# Patient Record
Sex: Female | Born: 1945 | Race: White | Hispanic: No | Marital: Married | Smoking: Never smoker
Health system: Southern US, Community
[De-identification: ages and names within clinical notes are randomized; demographics above are authoritative.]

## PROBLEM LIST (undated history)

## (undated) DIAGNOSIS — T8859XA Other complications of anesthesia, initial encounter: Secondary | ICD-10-CM

## (undated) DIAGNOSIS — R51 Headache: Secondary | ICD-10-CM

## (undated) DIAGNOSIS — Z9889 Other specified postprocedural states: Secondary | ICD-10-CM

## (undated) DIAGNOSIS — I82609 Acute embolism and thrombosis of unspecified veins of unspecified upper extremity: Secondary | ICD-10-CM

## (undated) DIAGNOSIS — K219 Gastro-esophageal reflux disease without esophagitis: Secondary | ICD-10-CM

## (undated) DIAGNOSIS — R112 Nausea with vomiting, unspecified: Secondary | ICD-10-CM

## (undated) DIAGNOSIS — K639 Disease of intestine, unspecified: Secondary | ICD-10-CM

## (undated) DIAGNOSIS — Z86718 Personal history of other venous thrombosis and embolism: Secondary | ICD-10-CM

## (undated) DIAGNOSIS — T7840XA Allergy, unspecified, initial encounter: Secondary | ICD-10-CM

## (undated) DIAGNOSIS — I471 Supraventricular tachycardia: Secondary | ICD-10-CM

## (undated) DIAGNOSIS — T4145XA Adverse effect of unspecified anesthetic, initial encounter: Secondary | ICD-10-CM

## (undated) DIAGNOSIS — K589 Irritable bowel syndrome without diarrhea: Secondary | ICD-10-CM

## (undated) DIAGNOSIS — D689 Coagulation defect, unspecified: Secondary | ICD-10-CM

## (undated) HISTORY — DX: Coagulation defect, unspecified: D68.9

## (undated) HISTORY — DX: Gastro-esophageal reflux disease without esophagitis: K21.9

## (undated) HISTORY — DX: Supraventricular tachycardia: I47.1

## (undated) HISTORY — DX: Acute embolism and thrombosis of unspecified veins of unspecified upper extremity: I82.609

## (undated) HISTORY — DX: Allergy, unspecified, initial encounter: T78.40XA

## (undated) HISTORY — DX: Irritable bowel syndrome, unspecified: K58.9

## (undated) HISTORY — DX: Disease of intestine, unspecified: K63.9

## (undated) HISTORY — DX: Personal history of other venous thrombosis and embolism: Z86.718

---

## 1974-11-10 HISTORY — PX: OTHER SURGICAL HISTORY: SHX169

## 1974-11-10 HISTORY — PX: BREAST EXCISIONAL BIOPSY: SUR124

## 1975-11-11 HISTORY — PX: CYSTECTOMY: SUR359

## 1996-09-28 ENCOUNTER — Encounter: Payer: Self-pay | Admitting: Internal Medicine

## 1999-06-24 ENCOUNTER — Other Ambulatory Visit: Admission: RE | Admit: 1999-06-24 | Discharge: 1999-06-24 | Payer: Self-pay | Admitting: Family Medicine

## 2000-01-29 ENCOUNTER — Encounter: Payer: Self-pay | Admitting: Family Medicine

## 2000-01-29 ENCOUNTER — Encounter: Admission: RE | Admit: 2000-01-29 | Discharge: 2000-01-29 | Payer: Self-pay | Admitting: Family Medicine

## 2000-05-12 ENCOUNTER — Other Ambulatory Visit: Admission: RE | Admit: 2000-05-12 | Discharge: 2000-05-12 | Payer: Self-pay | Admitting: Family Medicine

## 2001-03-17 ENCOUNTER — Emergency Department (HOSPITAL_COMMUNITY): Admission: EM | Admit: 2001-03-17 | Discharge: 2001-03-17 | Payer: Self-pay | Admitting: Emergency Medicine

## 2001-05-31 ENCOUNTER — Encounter: Payer: Self-pay | Admitting: Obstetrics and Gynecology

## 2001-05-31 ENCOUNTER — Encounter: Admission: RE | Admit: 2001-05-31 | Discharge: 2001-05-31 | Payer: Self-pay | Admitting: Obstetrics and Gynecology

## 2001-06-01 ENCOUNTER — Other Ambulatory Visit: Admission: RE | Admit: 2001-06-01 | Discharge: 2001-06-01 | Payer: Self-pay | Admitting: Obstetrics and Gynecology

## 2001-06-01 ENCOUNTER — Encounter (INDEPENDENT_AMBULATORY_CARE_PROVIDER_SITE_OTHER): Payer: Self-pay

## 2002-01-12 ENCOUNTER — Encounter: Payer: Self-pay | Admitting: Family Medicine

## 2002-01-12 ENCOUNTER — Ambulatory Visit (HOSPITAL_COMMUNITY): Admission: RE | Admit: 2002-01-12 | Discharge: 2002-01-12 | Payer: Self-pay | Admitting: Family Medicine

## 2002-08-26 ENCOUNTER — Encounter: Payer: Self-pay | Admitting: Obstetrics and Gynecology

## 2002-08-26 ENCOUNTER — Encounter: Admission: RE | Admit: 2002-08-26 | Discharge: 2002-08-26 | Payer: Self-pay | Admitting: Obstetrics and Gynecology

## 2003-04-19 ENCOUNTER — Other Ambulatory Visit: Admission: RE | Admit: 2003-04-19 | Discharge: 2003-04-19 | Payer: Self-pay | Admitting: Obstetrics and Gynecology

## 2003-06-07 ENCOUNTER — Encounter: Payer: Self-pay | Admitting: Obstetrics and Gynecology

## 2003-06-07 ENCOUNTER — Encounter: Admission: RE | Admit: 2003-06-07 | Discharge: 2003-06-07 | Payer: Self-pay | Admitting: Obstetrics and Gynecology

## 2004-04-15 ENCOUNTER — Encounter: Admission: RE | Admit: 2004-04-15 | Discharge: 2004-04-15 | Payer: Self-pay | Admitting: Obstetrics and Gynecology

## 2004-04-19 ENCOUNTER — Encounter: Admission: RE | Admit: 2004-04-19 | Discharge: 2004-04-19 | Payer: Self-pay | Admitting: Obstetrics and Gynecology

## 2004-05-07 ENCOUNTER — Other Ambulatory Visit: Admission: RE | Admit: 2004-05-07 | Discharge: 2004-05-07 | Payer: Self-pay | Admitting: Obstetrics and Gynecology

## 2005-04-25 ENCOUNTER — Encounter: Admission: RE | Admit: 2005-04-25 | Discharge: 2005-04-25 | Payer: Self-pay | Admitting: Obstetrics and Gynecology

## 2005-05-09 ENCOUNTER — Other Ambulatory Visit: Admission: RE | Admit: 2005-05-09 | Discharge: 2005-05-09 | Payer: Self-pay | Admitting: Obstetrics and Gynecology

## 2006-04-29 ENCOUNTER — Encounter: Admission: RE | Admit: 2006-04-29 | Discharge: 2006-04-29 | Payer: Self-pay | Admitting: Obstetrics and Gynecology

## 2007-05-12 ENCOUNTER — Encounter: Admission: RE | Admit: 2007-05-12 | Discharge: 2007-05-12 | Payer: Self-pay | Admitting: Obstetrics and Gynecology

## 2008-05-08 ENCOUNTER — Telehealth: Payer: Self-pay | Admitting: Gastroenterology

## 2008-05-10 ENCOUNTER — Ambulatory Visit: Payer: Self-pay | Admitting: Internal Medicine

## 2008-05-10 DIAGNOSIS — E785 Hyperlipidemia, unspecified: Secondary | ICD-10-CM

## 2008-05-10 HISTORY — DX: Hyperlipidemia, unspecified: E78.5

## 2008-05-11 DIAGNOSIS — K6289 Other specified diseases of anus and rectum: Secondary | ICD-10-CM

## 2008-05-15 ENCOUNTER — Encounter: Admission: RE | Admit: 2008-05-15 | Discharge: 2008-05-15 | Payer: Self-pay | Admitting: Family Medicine

## 2008-05-18 ENCOUNTER — Telehealth: Payer: Self-pay | Admitting: Internal Medicine

## 2008-05-23 ENCOUNTER — Ambulatory Visit: Payer: Self-pay | Admitting: Internal Medicine

## 2008-06-16 ENCOUNTER — Telehealth: Payer: Self-pay | Admitting: Internal Medicine

## 2008-06-19 ENCOUNTER — Telehealth: Payer: Self-pay | Admitting: Internal Medicine

## 2008-06-21 ENCOUNTER — Telehealth: Payer: Self-pay | Admitting: Internal Medicine

## 2008-06-21 DIAGNOSIS — K648 Other hemorrhoids: Secondary | ICD-10-CM

## 2008-06-21 HISTORY — DX: Other hemorrhoids: K64.8

## 2008-06-22 ENCOUNTER — Ambulatory Visit: Payer: Self-pay | Admitting: Gastroenterology

## 2009-05-16 ENCOUNTER — Encounter: Admission: RE | Admit: 2009-05-16 | Discharge: 2009-05-16 | Payer: Self-pay | Admitting: Obstetrics and Gynecology

## 2009-05-22 ENCOUNTER — Encounter: Admission: RE | Admit: 2009-05-22 | Discharge: 2009-05-22 | Payer: Self-pay | Admitting: Obstetrics and Gynecology

## 2010-03-10 ENCOUNTER — Encounter: Payer: Self-pay | Admitting: Internal Medicine

## 2010-03-10 ENCOUNTER — Emergency Department (HOSPITAL_COMMUNITY): Admission: EM | Admit: 2010-03-10 | Discharge: 2010-03-10 | Payer: Self-pay | Admitting: Emergency Medicine

## 2010-03-11 ENCOUNTER — Encounter: Payer: Self-pay | Admitting: Internal Medicine

## 2010-04-22 ENCOUNTER — Encounter: Payer: Self-pay | Admitting: Internal Medicine

## 2010-04-25 ENCOUNTER — Ambulatory Visit: Payer: Self-pay | Admitting: Internal Medicine

## 2010-04-25 DIAGNOSIS — I498 Other specified cardiac arrhythmias: Secondary | ICD-10-CM

## 2010-04-25 HISTORY — DX: Other specified cardiac arrhythmias: I49.8

## 2010-05-20 ENCOUNTER — Encounter: Admission: RE | Admit: 2010-05-20 | Discharge: 2010-05-20 | Payer: Self-pay | Admitting: Obstetrics and Gynecology

## 2010-08-09 ENCOUNTER — Ambulatory Visit: Payer: Self-pay | Admitting: Internal Medicine

## 2010-08-09 DIAGNOSIS — R002 Palpitations: Secondary | ICD-10-CM | POA: Insufficient documentation

## 2010-08-09 DIAGNOSIS — R61 Generalized hyperhidrosis: Secondary | ICD-10-CM

## 2010-08-09 HISTORY — DX: Palpitations: R00.2

## 2010-08-09 HISTORY — DX: Generalized hyperhidrosis: R61

## 2010-08-23 ENCOUNTER — Telehealth: Payer: Self-pay | Admitting: Internal Medicine

## 2010-09-12 ENCOUNTER — Telehealth: Payer: Self-pay | Admitting: Internal Medicine

## 2010-09-17 ENCOUNTER — Telehealth (INDEPENDENT_AMBULATORY_CARE_PROVIDER_SITE_OTHER): Payer: Self-pay | Admitting: *Deleted

## 2010-09-18 ENCOUNTER — Telehealth (INDEPENDENT_AMBULATORY_CARE_PROVIDER_SITE_OTHER): Payer: Self-pay | Admitting: *Deleted

## 2010-09-20 ENCOUNTER — Ambulatory Visit: Payer: Self-pay | Admitting: Internal Medicine

## 2010-10-28 ENCOUNTER — Telehealth: Payer: Self-pay | Admitting: Internal Medicine

## 2010-11-21 ENCOUNTER — Encounter
Admission: RE | Admit: 2010-11-21 | Discharge: 2010-11-21 | Payer: Self-pay | Source: Home / Self Care | Attending: Obstetrics and Gynecology | Admitting: Obstetrics and Gynecology

## 2010-12-01 ENCOUNTER — Encounter: Payer: Self-pay | Admitting: Obstetrics and Gynecology

## 2010-12-02 ENCOUNTER — Encounter: Payer: Self-pay | Admitting: Obstetrics and Gynecology

## 2010-12-10 NOTE — Progress Notes (Signed)
Summary: Event Monitor  Phone Note Outgoing Call Call back at [facility] Phone [phone] Call back at Work Phone [phone]   Call placed by: Stanton Kidney, EMT-P,  September 18, 2010 2:19 PM Summary of Call: Left message for Pt. to call us to schedule 30 day event monitor. Stanton Kidney, EMT-P  September 18, 2010 2:19 PM   Follow-up for Phone Call        Pt had monitor done 11/11 Follow-up by: Marcos Eke,  September 24, 2010 8:55 AM

## 2010-12-10 NOTE — Progress Notes (Signed)
  Phone Note Outgoing Call   Call placed by: rhonda Call placed to: Patient Summary of Call: left message to adv pt ok to come off Cardizem per Dr. Graciela Husbands. Will also order a 30 day event monitor that is patient activated, single event with auto detection.  Initial call taken by: Claris Gladden RN,  September 17, 2010 6:17 PM

## 2010-12-10 NOTE — Progress Notes (Signed)
Summary: calling regarding heart monitor  Phone Note Call from Patient Call back at [phone]   Caller: Patient Summary of Call: Pt calling regarding a heart monitor Initial call taken by: Judie Grieve,  September 12, 2010 11:24 AM  Follow-up for Phone Call        spoke w/pt and she has increased her vit-d and the night sweats, fatigue and problem sleeping seemed to have resolved.  adv her that we do not have resolution on the monitor.  she is wondering if she can come off the Diltiazem. Adv her that Dr. Graciela Husbands will make that decision.  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 We concur it may be a cause but may not be the only problem.  Follow-up by: Claris Gladden RN,  September 12, 2010 12:10 PM

## 2010-12-10 NOTE — Assessment & Plan Note (Signed)
Summary: nep/SVT   Primary Provider:  Sheliah Hatch   History of Present Illness: [name] is seen at the request of Dr Jeannetta Nap for treatment of SVT,  She is the mother of one of the nurses on 2000;  she has a 3 month history of recurrent abrupt onset tacdhypalps assoc with LH and presyncope, with SOB and flushing.  They are frog positive and diuretic positve.  The freq can be daily but now is only ocuring 2-3/wk with episodes lsating 20-30 imn.  They freuqently occur at night and are assoc w residual fatigue.    Aprrrox 6 weeks ago, she ended up with an episode that lasted all day long. Upon arrival at [facility] ER she was given adenosine with termination. Her heart rate was documented at 170 beats per minute. These records were reviewed.  gross able to obtain and echo cardiogram obtained at Dr. Elvis Coil office demonstrated normal left ventricular function   At that time it was recommended that she discontinue caffeine and decongestants as well as her antimigraine therapies. She has not noted a significant change.  apart from these spells, she denies exercise intolerance nocturnal dyspnea orthopnea. She does not have any snoring.  She has noted nonexertional arm and back discomfort etc. quite limited.  She has a long-standing history of dyslipidemia for which his statin therapy has been prescribed. She noted that with the jaw discomfort any worse she discontinued her statin. The symptoms resolved. She resumed her statin and he recurred. Because of that she has been started more recently on lovastatin as her placement for her atorvastatin  Current Medications (verified): 1)  Vitamin D 13086 Unit  Caps (Ergocalciferol) .... One Tablet By Mouth Every 2 Wks 2)  Calcium Carbonate-Vitamin D 600-400 Mg-Unit  Tabs (Calcium Carbonate-Vitamin D) .... One Tablet By Mouth Once Daily 3)  Aspirin 81 Mg  Tabs (Aspirin) .... One Tablet By Mouth Once Daily 4)  Lovastatin 20 Mg Tabs (Lovastatin) .... One  Tablet At Bedtime 5)  B Complex Vitamins  Caps (B Complex Vitamins) .... Once Daily 6)  Metoprolol Tartrate 25 Mg Tabs (Metoprolol Tartrate) .... One Tablet Once Daily 7)  Daily Multi  Tabs (Multiple Vitamins-Minerals) .... Take One Tablet Once Daily  Allergies (verified): 1)  ! Sulfa 2)  ! * Estrogen 3)  ! Codeine  Past History:  Past Surgical History: Last updated: 05/10/2008 Cyst removed from the Left Breast-1977  Family History: Last updated: 05/10/2008 Family History of Colon Cancer:Mother late 28's early 17's Family History of Colon Polyps:Mother Family History of Diabetes: Mother,Father Family History of Heart Disease: Mother, Father Family History of Irritable Bowel Syndrome:Mother  Social History: Last updated: 06/22/2008 Married Occupation: Runner, 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  Alcohol Use - no Daily Caffeine Use Illicit Drug Use - no Patient gets regular exercise.  Past Medical History: Blood clots -1976,1981(thrombosis of the arm) supraventricular tachycardia hyperlipidemia GE reflux disease/irritable bowel disease  Review of Systems       full review of systems was negative apart from a history of present illness and past medical history. Tinnitus and perimenopausal night sweats  Vital Signs:  Patient profile:   65 year old female Height:      64 inches Weight:      194 pounds BMI:     33.42 Pulse rate:   64 / minute Pulse rhythm:   regular BP sitting:   120 / 76  (left arm) Cuff size:   large  Vitals Entered By: Judithe Modest CMA (  April 25, 2010 3:24 PM)  Physical Exam  General:  Alert and orientedmiddle aged [place] female in no acute distress. HEENT  normal . Neck veins were flat; carotids brisk and full without bruits. No lymphadenopathy. Back without kyphosis. Lungs clear. Heart sounds regular without murmurs or gallops. PMI nondisplaced. Abdomen soft with active bowel sounds without midline pulsation or hepatomegaly. Femoral pulses and  distal pulses intact. Extremities were without clubbing cyanosis or edemaSkin warm and dry. Neurological exam grossly normal LN neg; affect engaging   EKG  Procedure date:  03/10/2010  Findings:      neuro QRS tachycardia at a cycle length of 350 ms R. 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 She's had significant and concerning symptoms including lightheadedness and presyncope. We discussed treatment options including alternative AV nodal blocking agents, antiarrhythmic drug therapy with the potential for poor arrhythmia, and catheter ablation. We discussed the potential benefits as well as the potential risks including but not limited to death and heart block requiring pacemaker implantation. She has had significant fatigue associated with her current dose of beta blocker.  After the discussion she elected to pursue medical therapy. We have given her prescriptions to metoprolol succinate 50, atenolol 50, and diltiazem 120. She will them inn  random order and we will see her again in about 8 weeks' time. Her updated medication list for this problem includes:    Aspirin 81 Mg Tabs (Aspirin) ..... One tablet by mouth once daily    Metoprolol Tartrate 25 Mg Tabs (Metoprolol tartrate) ..... One tablet once daily  Orders: EKG w/ Interpretation (93000)  Patient Instructions: 1)  Your physician has recommended you make the following change in your medication: Try Metoprolol, Atenolol, and Cardizem.  Let us know which one you like the best. 2)  Your physician recommends that you schedule a follow-up appointment in: 2  months with Dr Graciela Husbands.

## 2010-12-10 NOTE — Letter (Signed)
Summary: [facility]  [facility]   Imported By: Marylou Mccoy 05/21/2010 08:56:19  _____________________________________________________________________  External Attachment:    Type:   Image     Comment:   External Document

## 2010-12-10 NOTE — Progress Notes (Signed)
Summary: ? ON MONITOR  Phone Note Call from Patient Call back at [phone]   Caller: Patient Reason for Call: Talk to Nurse Summary of Call: QUESTION ABOUT MONITOR. PT STATES DR. Rene Kocher GOING TO CALL HER ABOUT GETTING ONE FOR A MONTH. Initial call taken by: Roe Coombs,  August 23, 2010 9:17 AM  Follow-up for Phone Call        checking into what kind of monitor is needed Meredith Staggers, RN  August 23, 2010 10:01 AM   per Dr Odessa Fleming note needs a MCOT not sure if 24, 48 or 30 day event is needed, will have Dr Graciela Husbands review on Wed and will let pt know, pt also states she had a tsh w/pcp and it was 3.16, she has not had a cbc or vit D level Meredith Staggers, RN  August 23, 2010 10:32 AM   Additional Follow-up for Phone Call Additional follow up Details #1::        left msg for pt that will discuss w/ Dr. Graciela Husbands. He had intended to use another vendor's monitor but may not be the case now.  Additional Follow-up by: Claris Gladden RN,  August 26, 2010 2:34 PM    Additional Follow-up for Phone Call Additional follow up Details #2::    30 day mcot monitor is great Follow-up by: Nathen May, MD, [facility],  August 29, 2010 6:27 PM

## 2010-12-10 NOTE — Assessment & Plan Note (Signed)
Summary: rov per pt call/lg   Visit Type:  Follow-up Primary Provider:  Sheliah Hatch  CC:  tachycardia.  History of Present Illness: [name] is seen in followup for SVT,  after lengthy discussions at her last visit, we elected to pursue medical therapy as opposed to catheter ablation.  she had a very quiet summer. However upon return to school her symptoms have recurred. She is on a heart rate monitoring she notes that her tachy palpitations are associated with a heart rate of 110 quite distinct from the documented heart rate of 170 with her SVT.  She does have some lightheadedness with this these episodes are also accompanied by nausea and some diaphoresis. She denies shower intolerance or orthostatic intolerance.  She also reports night sweats dating back to January. Interval has been associated with some weight gain and no appreciable change in her bowel pattern. She is not sure whether thyroid testing has been done     echo cardiogram obtained at Dr. Elvis Coil office demonstrated normal left ventricular function      Current Medications (verified): 1)  Vitamin D 19147 Unit  Caps (Ergocalciferol) .... One Tablet By Mouth Every 2 Wks 2)  Calcium Carbonate-Vitamin D 600-400 Mg-Unit  Tabs (Calcium Carbonate-Vitamin D) .... One Tablet By Mouth Once Daily 3)  Aspirin 81 Mg  Tabs (Aspirin) .... One Tablet By Mouth Once Daily 4)  Lovastatin 20 Mg Tabs (Lovastatin) .... One Tablet At Bedtime 5)  B Complex Vitamins  Caps (B Complex Vitamins) .... Once Daily 6)  Metoprolol Tartrate 25 Mg Tabs (Metoprolol Tartrate) .... One Tablet Once Daily 7)  Daily Multi  Tabs (Multiple Vitamins-Minerals) .... Take One Tablet Once Daily 8)  Diltiazem Hcl Cr 120 Mg Xr12h-Cap (Diltiazem Hcl) .... Take One Tablet Once Daily  Allergies (verified): 1)  ! Sulfa 2)  ! * Estrogen 3)  ! Codeine  Past History:  Past Medical History: Last updated: 04/25/2010 Blood clots -1976,1981(thrombosis of the  arm) supraventricular tachycardia hyperlipidemia GE reflux disease/irritable bowel disease  Past Surgical History: Last updated: 05/10/2008 Cyst removed from the Left Breast-1977  Family History: Last updated: 05/10/2008 Family History of Colon Cancer:Mother late 63's early 74's Family History of Colon Polyps:Mother Family History of Diabetes: Mother,Father Family History of Heart Disease: Mother, Father Family History of Irritable Bowel Syndrome:Mother  Social History: Last updated: 06/22/2008 Married Occupation: Runner, 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  Alcohol Use - no Daily Caffeine Use Illicit Drug Use - no Patient gets regular exercise.  Risk Factors: Exercise: yes (06/22/2008)  Risk Factors: Smoking Status: never (05/10/2008)  Vital Signs:  Patient profile:   65 year old female Height:      64 inches Weight:      191 pounds BMI:     32.90 Pulse rate:   65 / minute BP sitting:   128 / 73  (left arm) Cuff size:   regular  Vitals Entered By: Hardin Negus, RMA (August 09, 2010 4:29 PM)  Physical Exam  General:  The patient was alert and oriented in no acute distress. HEENT Normal.  Neck veins were flat, carotids were brisk.  Lungs were clear.  Heart sounds were regular without murmurs or gallops.  Abdomen was soft with active bowel sounds. There is no clubbing cyanosis or edema. Skin Warm and dry    Impression & Recommendations:  Problem # 1:  SUPRAVENTRICULAR TACHYCARDIA (ICD-427.89) the patient has not had any clear episodes of her rapid SVT. She is tolerating her medications relatively well.  She has had documented heart rates of over 100 however. This is distinct from 170 or so  for SVT. The associated symptoms suggest that this might be related to an autonomic problem. The fact that his occurred during school resolved during the summer and has reoccurred with the beginning of the school year makes me wonder whether it is a stress manifestation.     We have discussed the use of event recording tools to try to elucidate whether this is a primary or secondary rhythm issue as it would help direct therapeutic options. The value of  a MCOT monitor will depend on her ability to retrieve reliably information from patient activated logs. My recollection is that we just met with a company that may have the potential for [place]. Her updated medication list for this problem includes:    Aspirin 81 Mg Tabs (Aspirin) ..... One tablet by mouth once daily    Metoprolol Tartrate 25 Mg Tabs (Metoprolol tartrate) ..... One tablet once daily    Diltiazem Hcl Cr 120 Mg Xr12h-cap (Diltiazem hcl) .Marland Kitchen... Take one tablet once daily  Problem # 2:  PALPITATIONS (ICD-785.1) as above Her updated medication list for this problem includes:    Aspirin 81 Mg Tabs (Aspirin) ..... One tablet by mouth once daily    Metoprolol Tartrate 25 Mg Tabs (Metoprolol tartrate) ..... One tablet once daily    Diltiazem Hcl Cr 120 Mg Xr12h-cap (Diltiazem hcl) .Marland Kitchen... Take one tablet once daily  Problem # 3:  DIAPHORESIS (ICD-780.8) she has had diaphoresis with these palpitation episodes as well his nocturnal diaphoresis. She is well postmenopausal and had had 5 or 6 years of no night sweats. This raises concerns regarding other mechanisms. Identification of the heart rate may be clarifying as there may in fact be a primary rhythm issue. We will try to retrieve results regarding her thyroid and CBC from Dr. Hennie Duos office.  Patient Instructions: 1)  Your physician recommends that you schedule a follow-up appointment in: 6 months 2)  Get information from Dr. Hennie Duos office on thyroid and blood count 3)  Will utilize an event recorder to try to understand the mechanism of the palpitations

## 2010-12-10 NOTE — Letter (Signed)
Summary: [facility] - [facility]  [facility]   Imported By: Marylou Mccoy 05/21/2010 08:57:38  _____________________________________________________________________  External Attachment:    Type:   Image     Comment:   External Document

## 2010-12-12 NOTE — Progress Notes (Addendum)
Summary: results from heart monitor  Phone Note Call from Patient Call back at Home Phone [phone] Call back at [phone]   Caller: Patient Summary of Call: Results from heart monitor Initial call taken by: Judie Grieve,  October 28, 2010 11:23 AM  Follow-up for Phone Call        left msg to adv have results and not reviewed by md. adv will call with results when have them.  Follow-up by: Claris Gladden RN,  October 28, 2010 3:46 PM     Appended Document: results from heart monitor monitor is normal  steve  Appended Document: results from heart monitor adv pt of normal results. Claris Gladden, RN, BSN

## 2010-12-12 NOTE — Procedures (Signed)
Summary: Summary Report  Summary Report   Imported By: Erle Crocker 11/13/2010 13:23:01  _____________________________________________________________________  External Attachment:    Type:   Image     Comment:   External Document

## 2010-12-13 NOTE — Letter (Signed)
Summary: [facility]  [facility]   Imported By: Marylou Mccoy 05/20/2010 16:57:03  _____________________________________________________________________  External Attachment:    Type:   Image     Comment:   External Document

## 2011-01-28 LAB — POCT CARDIAC MARKERS: Troponin i, poc: <0.05 ng/mL (ref 0.00–0.09)

## 2011-01-28 LAB — POCT I-STAT, CHEM 8
Glucose, Bld: 123 mg/dL — ABNORMAL HIGH (ref 70–99)
Hemoglobin: 16 g/dL — ABNORMAL HIGH (ref 12.0–15.0)
Potassium: 4.1 mEq/L (ref 3.5–5.1)

## 2011-05-29 ENCOUNTER — Other Ambulatory Visit: Payer: Self-pay | Admitting: Obstetrics and Gynecology

## 2011-05-29 DIAGNOSIS — Z1231 Encounter for screening mammogram for malignant neoplasm of breast: Secondary | ICD-10-CM

## 2011-06-04 ENCOUNTER — Ambulatory Visit: Payer: Self-pay

## 2011-06-06 ENCOUNTER — Ambulatory Visit
Admission: RE | Admit: 2011-06-06 | Discharge: 2011-06-06 | Disposition: A | Discharge status: Home / Self Care | Payer: BC Managed Care – PPO | Source: Ambulatory Visit | Attending: Obstetrics and Gynecology | Admitting: Obstetrics and Gynecology

## 2011-06-06 DIAGNOSIS — Z1231 Encounter for screening mammogram for malignant neoplasm of breast: Secondary | ICD-10-CM

## 2012-06-14 ENCOUNTER — Other Ambulatory Visit: Payer: Self-pay | Admitting: Obstetrics and Gynecology

## 2012-06-14 DIAGNOSIS — Z1231 Encounter for screening mammogram for malignant neoplasm of breast: Secondary | ICD-10-CM

## 2012-06-17 ENCOUNTER — Ambulatory Visit
Admission: RE | Admit: 2012-06-17 | Discharge: 2012-06-17 | Disposition: A | Discharge status: Home / Self Care | Payer: BC Managed Care – PPO | Source: Ambulatory Visit | Attending: Obstetrics and Gynecology | Admitting: Obstetrics and Gynecology

## 2012-06-17 DIAGNOSIS — Z1231 Encounter for screening mammogram for malignant neoplasm of breast: Secondary | ICD-10-CM

## 2012-07-14 ENCOUNTER — Telehealth: Payer: Self-pay | Admitting: Internal Medicine

## 2012-07-14 NOTE — Telephone Encounter (Signed)
New problem:  C/O svt x 3 days, lasting longer. Heart rate 165. B/p 180/110  Was taken on 9/3.

## 2012-07-14 NOTE — Telephone Encounter (Signed)
I spoke with the patient. She states that on Sunday she had 2 episodes of what she thinks was SVT, lasting about 20-30 minutes each. She states her HR was around 140 bpm. On Monday, she had another 2 episodes of SVT. Tuesday, she complained of being lightheaded. She noted her HR to be around 50 bpm at the time then she went to 160 bpm after that and this lasted about 2 & 1/2 hours. When this broke her rates were around 80-90 bpm. She felt very fatigued after that and slept for a while. She states she has tried bearing down during some of these episodes and this helped a little. She is concerned now that she has had episodes for the last 3 days. Her BP during the one yesterday was 180/110, then came down to 160/90. She is on verapamil 180 mg once daily. Today she feels a little lightheaded. Her HR at rest this morning was 50, she is now about 85 bpm. She has not checked her BP today. I explained to the patient I would review with the DOD and call her back. She is agreeable.

## 2012-07-14 NOTE — Telephone Encounter (Signed)
I reviewed the patient's symptoms with Dr. [name]. He recommends non- urgent follow up with Dr. Graciela Husbands for possible discussion of SVT ablation. In the interim, the patient may take an extra verapamil ER 180 mg at the very beginning of her symptoms. She is ok to take a total of verapamil 360 mg daily if needed. I have explained this to the patient and she is aware. She will come and see Dr. Graciela Husbands on 9/12 at 8:30 am. She has been advised to call back should the extra verapamil not help improve her symptoms.

## 2012-07-21 ENCOUNTER — Encounter: Payer: Self-pay | Admitting: Cardiology

## 2012-07-22 ENCOUNTER — Encounter: Payer: Self-pay | Admitting: Internal Medicine

## 2012-07-22 ENCOUNTER — Ambulatory Visit (INDEPENDENT_AMBULATORY_CARE_PROVIDER_SITE_OTHER): Payer: BC Managed Care – PPO | Admitting: Internal Medicine

## 2012-07-22 VITALS — BP 121/70 | HR 67 | Ht 64.0 in | Wt 191.1 lb

## 2012-07-22 DIAGNOSIS — Z86718 Personal history of other venous thrombosis and embolism: Secondary | ICD-10-CM

## 2012-07-22 DIAGNOSIS — I471 Supraventricular tachycardia: Secondary | ICD-10-CM

## 2012-07-22 DIAGNOSIS — R079 Chest pain, unspecified: Secondary | ICD-10-CM

## 2012-07-22 DIAGNOSIS — I498 Other specified cardiac arrhythmias: Secondary | ICD-10-CM

## 2012-07-22 NOTE — Assessment & Plan Note (Signed)
As above.

## 2012-07-22 NOTE — Assessment & Plan Note (Signed)
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 She's had clinical recurrences and at this juncture, after 2 years of medical therapy, would like to proceed with catheter ablation. I have reviewed with her potential benefits and risks including but not limited to heart block perforation and venous thrombosis. (See below). She would like to proceed.  However, she's also had recent problems with chest pain and has changes with her electrocardiogram include stress testing for appropriate evaluation. We will submit her for stress echo.

## 2012-07-22 NOTE — Patient Instructions (Addendum)
We will be in touch with you when Dr. Graciela Husbands talks to hematology.

## 2012-07-22 NOTE — Progress Notes (Signed)
Patient Care Team: Kaleen Mask, MD as PCP - General (Family Medicine)   HPI  [name] is a 66 y.o. 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  She was treated with dilt which she did not tolerate and wrapping of which he has been on. She has had intermittent problems with tachypalpitations most recently a couple weeks ago. They are frog positive and diuretic negative. Is modest diaphoresis she has had some left-sided chest pain of late. This has not been particularly associated with exertion. Her major exertion his kayaking. She's not sure if there is any injury to her shoulder area with kayaking.  Her cardiac risk factors are notable for family history of stroke. 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 Treated initially with Coumadin and has been on aspirin ever since.  She has hypercholesterolemia with levels of greater than 250. She is on statin therapy and has had elevated liver function tests.  Past Medical History  Diagnosis Date  . Hx of blood clots   . Thrombosis of arm   . Supraventricular tachycardia   . GERD (gastroesophageal reflux disease)   . Bowel disease     Past Surgical History  Procedure Date  . Cystectomy 1977    Left Breast     Current Outpatient Prescriptions  Medication Sig Dispense Refill  . aspirin 81 MG tablet Take 81 mg by mouth daily.      Marland Kitchen b complex vitamins capsule Take 1 capsule by mouth daily.      . Calcium Carbonate-Vitamin D 600-400 MG-UNIT per tablet Take 1 tablet by mouth daily.      . fish oil-omega-3 fatty acids 1000 MG capsule Take 2 g by mouth daily.      Marland Kitchen lovastatin (MEVACOR) 20 MG tablet Take 20 mg by mouth at bedtime.      . Multiple Vitamin (MULTIVITAMIN) tablet Take 1 tablet by mouth daily.      . verapamil (COVERA HS)  180 MG (CO) 24 hr tablet Take 180 mg by mouth daily.      . Vitamin D, Ergocalciferol, (DRISDOL) 50000 UNITS CAPS Take 50,000 Units by mouth as directed. Every 2 weeks        Allergies  Allergen Reactions  . Codeine     REACTION: adverse reaction  . Sulfonamide Derivatives     Review of Systems negative except from HPI and PMH  Physical Exam BP 121/70  Pulse 67  Ht 5\' 4"  (1.626 m)  Wt 191 lb 1.9 oz (86.691 kg)  BMI 32.81 kg/m2 Well developed and well nourished in no acute distress HENT normal E scleral and icterus clear Neck Supple JVP flat; carotids brisk and full Clear to ausculation  Regular rate and rhythm, no murmurs gallops or rub Soft with active bowel sounds No clubbing cyanosis none Edema Alert and oriented, grossly normal motor and sensory function Skin Warm and Dry  Electrocardiogram dated today shows sinus rhythm at 67 intervals 14/09/41 there are minor ST-T changes in the inferolateral leads which are new from 2011  Assessment and  Plan

## 2012-07-22 NOTE — Assessment & Plan Note (Signed)
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 I wonder whether she has a factor V leiden deficiency. I will review with hematology any specific issues that would be worth noting prior to proceeding with venous catheterization

## 2012-07-28 ENCOUNTER — Telehealth: Payer: Self-pay | Admitting: Internal Medicine

## 2012-07-28 DIAGNOSIS — R079 Chest pain, unspecified: Secondary | ICD-10-CM

## 2012-07-28 DIAGNOSIS — Z86718 Personal history of other venous thrombosis and embolism: Secondary | ICD-10-CM

## 2012-07-28 NOTE — Telephone Encounter (Signed)
I spoke with the patient. I explained I have sent Dr. Graciela Husbands a message to see if he has spoken with hematology. I have not heard anything back as of yet. I will call her back tomorrow to follow up. She is agreeable.

## 2012-07-28 NOTE — Telephone Encounter (Signed)
F/u  Pt calling for status on this matter

## 2012-07-28 NOTE — Telephone Encounter (Signed)
New Problem:    Patient called in because no one has called her about her hematology appointment and her cardiac testing.  Please call back.

## 2012-07-28 NOTE — Telephone Encounter (Signed)
I spoke with hematology. The recommendation was to undertake testing  For hypercoagulable panel  And to proceed with catheter ablation with the use of adjunctive heparin Thanks

## 2012-07-29 NOTE — Telephone Encounter (Signed)
F/u   Pt calling for status of referral, she can be reached at hM#

## 2012-07-29 NOTE — Telephone Encounter (Signed)
I spoke with the patient. She is aware of Dr. Odessa Fleming recommendations. We will send her to [facility] for the hypercoagulable panel to be drawn. I will order the stress echo and once these things are done, we can schedule her for her ablation. She voices understanding.

## 2012-07-29 NOTE — Telephone Encounter (Signed)
I clarified with Dr. Graciela Husbands, we are to draw a hypercoagulable panel. Per Mellody Dance, this will need to be done at the [facility] lab or [facility]. The patient's office note also recommends stress echo, per Dr. Graciela Husbands, we should go ahead and do this prior to proceeding with ablation. I have left a message at the patient's home number to discuss.

## 2012-07-30 ENCOUNTER — Other Ambulatory Visit: Payer: Self-pay | Admitting: Internal Medicine

## 2012-08-02 LAB — HYPERCOAGULABLE PANEL, COMPREHENSIVE RET.
AntiThromb III Func: 117 % (ref 76–126)
Anticardiolipin IgM: 8 MPL U/mL (ref <11)
Beta-2 Glyco I IgG: 0 G Units (ref <20)
Homocysteine: 10.1 umol/L (ref 4.0–15.4)
PTT Lupus Anticoagulant: 44.7 secs — ABNORMAL HIGH (ref 28.0–43.0)
PTTLA 4:1 Mix: 38.4 secs (ref 28.0–43.0)
Protein C, Total: 100 % (ref 72–160)
Protein S Activity: 179 % — ABNORMAL HIGH (ref 69–129)
Protein S Total: 131 % (ref 60–150)

## 2012-08-02 NOTE — Telephone Encounter (Signed)
Pt rtn your call and she would like for you to call her cell and leave a message

## 2012-08-02 NOTE — Telephone Encounter (Signed)
I have not tried to reach the patient. I spoke with Merita Norton to see if she tried to call the patient about stress echo appointment that has been scheduled. Per Merita Norton, she will call the patient. The patient hypercoaguable panel is also pending at this time.

## 2012-08-02 NOTE — Telephone Encounter (Signed)
Patient returning nurse call, she can be reached at [phone].

## 2012-08-05 ENCOUNTER — Ambulatory Visit (HOSPITAL_COMMUNITY): Payer: BC Managed Care – PPO | Attending: Cardiovascular Disease | Admitting: Radiology

## 2012-08-05 ENCOUNTER — Encounter: Payer: Self-pay | Admitting: Internal Medicine

## 2012-08-05 ENCOUNTER — Ambulatory Visit (HOSPITAL_COMMUNITY): Payer: BC Managed Care – PPO

## 2012-08-05 DIAGNOSIS — I498 Other specified cardiac arrhythmias: Secondary | ICD-10-CM | POA: Insufficient documentation

## 2012-08-05 DIAGNOSIS — R072 Precordial pain: Secondary | ICD-10-CM | POA: Insufficient documentation

## 2012-08-05 DIAGNOSIS — R42 Dizziness and giddiness: Secondary | ICD-10-CM | POA: Insufficient documentation

## 2012-08-05 DIAGNOSIS — R079 Chest pain, unspecified: Secondary | ICD-10-CM

## 2012-08-05 DIAGNOSIS — R0989 Other specified symptoms and signs involving the circulatory and respiratory systems: Secondary | ICD-10-CM

## 2012-08-05 DIAGNOSIS — R002 Palpitations: Secondary | ICD-10-CM | POA: Insufficient documentation

## 2012-08-05 NOTE — Progress Notes (Signed)
Stress Echocardiogram performed.  

## 2012-08-11 ENCOUNTER — Telehealth: Payer: Self-pay | Admitting: Internal Medicine

## 2012-08-11 NOTE — Telephone Encounter (Signed)
Best I can tell, the pt has had labs per hematology and stress echo.  After Dr. Graciela Husbands reviews Stress Echo, I will proceed with scheduling the ablation.  Dr. Graciela Husbands, will you be performing the ablation?  If so, please let me know what all you need to be set up, ie. Carto/ICE???

## 2012-08-11 NOTE — Telephone Encounter (Signed)
New problem:  Patient calling wants to know what the next step  - plan of care.

## 2012-08-13 NOTE — Telephone Encounter (Signed)
Left pt a message to call back. 

## 2012-08-13 NOTE — Telephone Encounter (Signed)
F/u   Patient calling for f/u status on this matter, she can be reached at home

## 2012-08-13 NOTE — Telephone Encounter (Signed)
Follow-up: ° ° ° °Patient returned a call.  Please call back. °

## 2012-08-13 NOTE — Telephone Encounter (Signed)
Patient states it has been three weeks since she had the stress echo and she is waiting to be schedule for ablation she needs to be schedule with enough time in advance, because her husband would like to  be off for the procedure. Pt states she has been very patients about it, but she would like something soon.

## 2012-08-18 NOTE — Telephone Encounter (Signed)
New Problem:    Patient called in wanting to know how to proceed.  Please call back.

## 2012-08-18 NOTE — Telephone Encounter (Signed)
Per Dr. [name].  Pt needs to be seen by him first before setting up SVT Ablation.  Pt notified and scheduled to see Dr. [name] 08/26/2012.

## 2012-08-26 ENCOUNTER — Encounter: Payer: Self-pay | Admitting: *Deleted

## 2012-08-26 ENCOUNTER — Encounter: Payer: Self-pay | Admitting: Internal Medicine

## 2012-08-26 ENCOUNTER — Ambulatory Visit (INDEPENDENT_AMBULATORY_CARE_PROVIDER_SITE_OTHER): Payer: BC Managed Care – PPO | Admitting: Internal Medicine

## 2012-08-26 VITALS — BP 118/71 | HR 57 | Ht 64.0 in | Wt 190.0 lb

## 2012-08-26 DIAGNOSIS — I471 Supraventricular tachycardia: Secondary | ICD-10-CM

## 2012-08-26 DIAGNOSIS — Z01812 Encounter for preprocedural laboratory examination: Secondary | ICD-10-CM

## 2012-08-26 DIAGNOSIS — I498 Other specified cardiac arrhythmias: Secondary | ICD-10-CM

## 2012-08-26 DIAGNOSIS — Z86718 Personal history of other venous thrombosis and embolism: Secondary | ICD-10-CM

## 2012-08-26 NOTE — Assessment & Plan Note (Signed)
The patient has undergone hypercoagulable workup and I've had a chance to review several of her labs. I am not sure how to interpret her findings. The main question going forward is whether or not additional treatment to prevent venous thromboemboli will be necessary at the time of her catheter ablation. I have planned to refer the patient to the hematologist for formal recommendation. We will plan to proceed with catheter ablation based on those recommendations.

## 2012-08-26 NOTE — Assessment & Plan Note (Signed)
I discussed the treatment options with the patient. The risk, goals, benefits, and expectations of catheter ablation of SVT have been discussed with the patient and she wishes to proceed. This be scheduled early as possible pending at time.

## 2012-08-26 NOTE — Progress Notes (Signed)
HPI [name] is referred by Dr. Graciela Husbands for evaluation and consideration for catheter ablation of SVT. The patient has a history of SVT for several years with documented heart rates up 170 beats per minute. Review of her electrocardiogram demonstrates a narrow QRS tachycardia. The episodes tend to start and stop suddenly. She has had several hospitalizations for her SVT. The patient also has a history of recurrent venous thromboembolism. Her history is quite extensive with multiple blood clots particularly in the right arm. She is recently undergone anticoagulation evaluation and has multiple abnormal values. She currently denies chest pain or shortness of breath. Her last episode of SVT with several weeks ago. She has been taking calcium channel blockers most recently. Despite her medications, she has had recurrence of her SVT. She is not taking any chronic anticoagulation. Allergies  Allergen Reactions  . Codeine     REACTION: adverse reaction  . Sulfonamide Derivatives      Current Outpatient Prescriptions  Medication Sig Dispense Refill  . aspirin 81 MG tablet Take 81 mg by mouth daily.      Marland Kitchen b complex vitamins capsule Take 1 capsule by mouth daily.      . Calcium Carbonate-Vitamin D 600-400 MG-UNIT per tablet Take 1 tablet by mouth daily.      . fish oil-omega-3 fatty acids 1000 MG capsule Take 2 g by mouth daily.      Marland Kitchen lovastatin (MEVACOR) 20 MG tablet Take 20 mg by mouth at bedtime.      . Multiple Vitamin (MULTIVITAMIN) tablet Take 1 tablet by mouth daily.      . verapamil (COVERA HS) 180 MG (CO) 24 hr tablet Take 180 mg by mouth daily.      . Vitamin D, Ergocalciferol, (DRISDOL) 50000 UNITS CAPS Take 50,000 Units by mouth as directed. Every 2 weeks         Past Medical History  Diagnosis Date  . Hx of blood clots   . Thrombosis of arm   . Supraventricular tachycardia   . GERD (gastroesophageal reflux disease)   . Bowel disease     ROS:   All systems reviewed and negative  except as noted in the HPI.   Past Surgical History  Procedure Date  . Cystectomy 1977    Left Breast      Family History  Problem Relation Age of Onset  . Colon cancer Mother     Late 73's to early 87's  . Colon polyps Mother   . Diabetes Mother   . Heart disease Mother   . Other Mother     Irritable Bowel Syndrome  . Diabetes Father   . Heart disease Father      History   Social History  . Marital Status: Married    Spouse Name: N/A    Number of Children: N/A  . Years of Education: N/A   Occupational History  . Not on file.   Social History Main Topics  . Smoking status: Never Smoker   . Smokeless tobacco: Never Used  . Alcohol Use: No  . Drug Use: No  . Sexually Active:    Other Topics Concern  . Not on file   Social History Narrative  . No narrative on file     BP 118/71  Pulse 57  Ht 5\' 4"  (1.626 m)  Wt 190 lb (86.183 kg)  BMI 32.61 kg/m2  SpO2 98%  Physical Exam:  Well appearing 66 year old woman, NAD HEENT: Unremarkable Neck:  No JVD,  no thyromegally Lungs:  Clear with no wheezes, rales, or rhonchi. HEART:  Regular rate rhythm, no murmurs, no rubs, no clicks Abd:  soft, positive bowel sounds, no organomegally, no rebound, no guarding Ext:  2 plus pulses, no edema, no cyanosis, no clubbing Skin:  No rashes no nodules Neuro:  CN II through XII intact, motor grossly intact  EKG normal sinus rhythm with no ventricular preexcitation.   Assess/Plan:

## 2012-08-26 NOTE — Patient Instructions (Signed)

## 2012-09-01 ENCOUNTER — Telehealth: Payer: Self-pay | Admitting: Internal Medicine

## 2012-09-01 NOTE — Telephone Encounter (Signed)
lmom at patient's home phone and cell number to return my call.  Dr [name] spoke with office at hematology to refer patient

## 2012-09-01 NOTE — Telephone Encounter (Signed)
plz return call to pt at cell or hm# regarding hematology referral.

## 2012-09-02 NOTE — Telephone Encounter (Signed)
Pt needs name of Hematologist

## 2012-09-03 NOTE — Telephone Encounter (Signed)
Called the office [phone]  Dr [name] had previously called on 08/26/12  I was connected to the voice mail of Tiffany who schedules new patients.  I left her a detailed message to call and get patient scheduled for her appointment.  I am calling the patient also.  She is aware

## 2012-09-03 NOTE — Telephone Encounter (Signed)
PT CALLING BACK WITH NAME OF HEMATOLOGIST, DATE AND TIME , PLS CALL [phone] UP TILL 2PM, OR CAN LEAVE MESSAGE AT HOME NUMBER [phone]

## 2012-09-06 ENCOUNTER — Telehealth: Payer: Self-pay | Admitting: Internal Medicine

## 2012-09-06 NOTE — Telephone Encounter (Signed)
Pt has not heard from Hematology so can you please call and get her an appt

## 2012-09-06 NOTE — Telephone Encounter (Signed)
Call Documentation     [name] 09/06/2012 4:12 PM Signed  Pt has not heard from Hematology so can you please call and get her an appt

## 2012-09-06 NOTE — Telephone Encounter (Signed)
See previous note

## 2012-09-07 ENCOUNTER — Telehealth: Payer: Self-pay | Admitting: Hematology and Oncology

## 2012-09-07 NOTE — Telephone Encounter (Signed)
C/D 09/07/12 for appt 09/16/12

## 2012-09-07 NOTE — Telephone Encounter (Signed)
S/W pt in re NP appt 11/07 @ 9:30 w/Dr. Dalene Carrow Referring Dr. Sharrell Ku Dx-Clotting Disorder NP packet emailed.

## 2012-09-07 NOTE — Telephone Encounter (Signed)
Called and left message again for Tiffany  She has scheduled patient to see Dr Dalene Carrow next [employer]

## 2012-09-16 ENCOUNTER — Encounter: Payer: Self-pay | Admitting: Hematology and Oncology

## 2012-09-16 ENCOUNTER — Telehealth: Payer: Self-pay | Admitting: Hematology and Oncology

## 2012-09-16 ENCOUNTER — Encounter (HOSPITAL_COMMUNITY): Payer: Self-pay | Admitting: Pharmacy Technician

## 2012-09-16 ENCOUNTER — Ambulatory Visit: Payer: BC Managed Care – PPO

## 2012-09-16 ENCOUNTER — Ambulatory Visit (HOSPITAL_BASED_OUTPATIENT_CLINIC_OR_DEPARTMENT_OTHER): Payer: BC Managed Care – PPO | Admitting: Hematology and Oncology

## 2012-09-16 ENCOUNTER — Ambulatory Visit (HOSPITAL_BASED_OUTPATIENT_CLINIC_OR_DEPARTMENT_OTHER): Payer: BC Managed Care – PPO | Admitting: Lab

## 2012-09-16 DIAGNOSIS — Z86718 Personal history of other venous thrombosis and embolism: Secondary | ICD-10-CM

## 2012-09-16 DIAGNOSIS — D6859 Other primary thrombophilia: Secondary | ICD-10-CM

## 2012-09-16 HISTORY — DX: Other primary thrombophilia: D68.59

## 2012-09-16 LAB — CBC WITH DIFFERENTIAL/PLATELET
BASO%: 0.5 % (ref 0.0–2.0)
Eosinophils Absolute: 0.1 10*3/uL (ref 0.0–0.5)
HCT: 43.4 % (ref 34.8–46.6)
LYMPH%: 31 % (ref 14.0–49.7)
MCHC: 33.9 g/dL (ref 31.5–36.0)
MONO#: 0.5 10*3/uL (ref 0.1–0.9)
NEUT#: 4.5 10*3/uL (ref 1.5–6.5)
NEUT%: 60.6 % (ref 38.4–76.8)
Platelets: 257 10*3/uL (ref 145–400)
RBC: 4.54 10*6/uL (ref 3.70–5.45)
WBC: 7.5 10*3/uL (ref 3.9–10.3)
lymph#: 2.3 10*3/uL (ref 0.9–3.3)

## 2012-09-16 LAB — COMPREHENSIVE METABOLIC PANEL (CC13)
Alkaline Phosphatase: 67 U/L (ref 40–150)
CO2: 28 mEq/L (ref 22–29)
Creatinine: 0.9 mg/dL (ref 0.6–1.1)
Glucose: 122 mg/dl — ABNORMAL HIGH (ref 70–99)
Sodium: 141 mEq/L (ref 136–145)
Total Bilirubin: 0.48 mg/dL (ref 0.20–1.20)
Total Protein: 6.8 g/dL (ref 6.4–8.3)

## 2012-09-16 NOTE — Progress Notes (Signed)
Checked in new pt with no financial concerns. °

## 2012-09-16 NOTE — Telephone Encounter (Signed)
Gave pt appt for for Doppler and CT Angio next week and pt will see Md on 09/23/12, pt will be at lab today 1:30pm

## 2012-09-16 NOTE — Patient Instructions (Addendum)
[name]  [id number]   [facility] - AFTER VISIT SUMMARY   **RECOMMENDATIONS MADE BY THE CONSULTANT AND ANY TEST    RESULTS WILL BE SENT TO YOUR REFERRING DOCTORS.   YOUR EXAM FINDINGS, LABS AND RESULTS WERE DISCUSSED BY YOUR MD TODAY.  YOU CAN GO TO THE [facility] WEB SITE FOR INSTRUCTIONS ON HOW TO ASSESS MY CHART FOR ADDITIONAL INFORMATION AS NEEDED.  Your Updated drug allergies are: Allergies as of 09/16/2012 - Review Complete 09/16/2012  Allergen Reaction Noted  . Codeine Nausea Only   . Estrogens  09/16/2012  . Sulfonamide derivatives Rash and Other (See Comments)     Your current list of medications are: Current Outpatient Prescriptions  Medication Sig Dispense Refill  . b complex vitamins capsule Take 1 capsule by mouth daily.      . Calcium Carbonate-Vitamin D 600-400 MG-UNIT per tablet Take 1 tablet by mouth daily.      . fish oil-omega-3 fatty acids 1000 MG capsule Take 2 g by mouth daily.      Marland Kitchen lovastatin (MEVACOR) 20 MG tablet Take 20 mg by mouth at bedtime.      . Multiple Vitamin (MULTIVITAMIN) tablet Take 1 tablet by mouth daily.      . verapamil (COVERA HS) 180 MG (CO) 24 hr tablet Take 180 mg by mouth daily.      . Vitamin D, Ergocalciferol, (DRISDOL) 50000 UNITS CAPS Take 50,000 Units by mouth as directed. Every 2 weeks         INSTRUCTIONS GIVEN AND DISCUSSED:  See attached schedule   SPECIAL INSTRUCTIONS/FOLLOW-UP:  See above.  I acknowledge that I have been informed and understand all the instructions given to me and received a copy.I know to contact the clinic, my physician, or go to the emergency Department if any problems should occur.   I do not have any more questions at this time, but understand that I may call the [facility] at [phone] during business hours should I have any further questions or need assistance in obtaining follow-up care.

## 2012-09-16 NOTE — Progress Notes (Signed)
Primary Care MD: Dr. Jeannetta Nap  GYN: Dr. Della Goo  [facility] to release info to:  Meliton Rattan (spouse) Lance Bosch (daughter)

## 2012-09-16 NOTE — Telephone Encounter (Signed)
Lab for 12/7 was a mistake per nurse of Dr. Dalene Carrow

## 2012-09-17 LAB — FACTOR 8 ASSAY: Coagulation Factor VIII: 144 % — ABNORMAL HIGH (ref 73–140)

## 2012-09-17 NOTE — Progress Notes (Signed)
CC:   [name]. Brandy Ridgel, MD Windle Guard, M.D.  IDENTIFYING STATEMENT:  The patient is a 66 year old woman seen at the request of Dr. Ladona Robertson with a hypercoagulable state.  HISTORY OF PRESENT ILLNESS:  [name] has a history of supraventricular tachycardia and plans to undergo a catheter ablation later this month. She gave a history of recurrent DVTs and as a result, Dr. Ladona Robertson had performed a hypercoagulable workup.  The results were abnormal and she was sent for hematological evaluation.  The patient gives a detailed history, summarized as such:  She recalls that in 1970 at the age of 45 she had presented with thrombosis of her right upper extremity while on the oral contraceptive pill for 13 days. It was felt that this was secondary to estrogen, which was discontinued. She was not placed on anticoagulation.  She did relatively well and in 1976 and 1977 had 2 children with no in-stent.  However, in 1980-1981 she presented with swelling in the right arm.  No intervention was undertaken.  In 1985 she had recurrent swelling in the same arm and at that time was evaluated with a venous sonogram.  Was found to have deep vein thrombosis with collaterals, was hospitalized and placed on Coumadin, which she took for 4 years.  She subsequently relocated to [facility] in 1989 and Coumadin was discontinued at that time, as her physician felt that she had had an adequate amount.  In 1999 during a flight to [place] she noted swelling in the right lower leg.  She was in [place] for about 3 weeks, returned to the [place], but did not seek medical care.  The patient reports that the swelling resolved.  She has not had an incident for 12 years.  The SVT has been an ongoing issue and she has become more symptomatic lately.  She feels more fatigued.  She states that her medications make her feel more sluggish. A structural echocardiogram/stress echo was essentially unremarkable. She is currently not  short of breath, but has noted periodic chest pains.  She has not lost any weight.  She denies hemoptysis.  Of some note, the patient notes that 3 older first cousins have had unexplained thrombosis.  Her mother had colon cancer.  Hypercoagulable panel drawn on 07/30/2012 was as follows:  Homocysteine normal at 10.1.  Anticardiolipin antibodies noted a normal IgA and IgM level of 4 and 8, respectively.  Anticardiolipin IgG antibody was elevated at 34; beta 2 glycoprotein IgG, IgA, and IgM were 0, 3, and 17, respectively; antithrombin III normal at 117%.  The patient was negative for factor V Leiden and prothrombin gene mutation. Lupus anticoagulant was not detected.  Protein C activity and S were not low.  PAST MEDICAL HISTORY:  SVTt, GERD, history recurrent thrombosis.  ALLERGIES:  Codeine, sulfonamides, estrogen.  MEDICATIONS:  Vitamin B complex, calcium with vitamin D, fish oil, Mevacor 20 mg daily, multivitamins, verapamil 180 mg daily, vitamin E.  FAMILY HISTORY:  Mother had colon cancer.  Father had mini strokes. Three older first generation cousins had unexpected blood clots.  SOCIAL HISTORY:  The patient is married with 3 children.  She has no siblings.  Her children do not have history of blood clots.  She denies alcohol or tobacco use.  She is a Copy.  HEALTH MAINTENANCE:  She is up-to-date with both colonoscopies and mammograms.  REVIEW OF SYSTEMS:  As above.  A 15-point review of systems negative.  PHYSICAL EXAM:  General:  Patient is alert and oriented x3.  Vitals: Pulse 76, blood pressure 122/76, temperature 97.3, respirations 20, weight 189.4 pounds.  HEENT:  Head is atraumatic, normocephalic. Sclerae anicteric.  Mouth moist.  Chest:  Clear.  Cardiovascular:  First and second heart sounds present.  No added sounds or murmurs.  Abdomen: Soft, nontender.  No masses.  Bowel sounds present.  Extremities:  No calf tenderness.  Right upper  extremity swelling and slightly tender. Lymph nodes:  No adenopathy.  CNS:  Nonfocal.  IMPRESSION AND PLAN:  [name] is a pleasant 66 year old woman with hypercoagulable state.  Most recent hypercoagulable panel indicates a low positive cardiolipin IgA.  The rest of the panel is unremarkable. 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 May have had a lower right extremity DVT, but this was never evaluated. She was on anticoagulation for 4 years and has not received therapy in 12 years now.  Has had no issues since that time.  She is due to undergo a catheter ablation.  I do not think that that will interfere with her ongoing state unless she will require prolonged immobilization. However, before I can render more concrete recommendations, I would like for her to complete a panel by obtaining a factor VIII level, D-dimer. In addition, I would like to evaluate her right upper extremity further with a Doppler.  Because of intermittent chest pains, I would think it would be ideal for her to obtain a CT angiogram just to rule out chronic pulmonary embolization.  We spent more than half the time in discussion about the hypercoagulable state and what it pertains.  She returns in a week's time with results.    ______________________________ Laurice Record, M.D. LIO/MEDQ  D:  09/16/2012  T:  09/17/2012  Job:  [id number]

## 2012-09-20 ENCOUNTER — Ambulatory Visit (HOSPITAL_COMMUNITY)
Admission: RE | Admit: 2012-09-20 | Discharge: 2012-09-20 | Disposition: A | Discharge status: Home / Self Care | Payer: BC Managed Care – PPO | Source: Ambulatory Visit | Attending: Hematology and Oncology | Admitting: Hematology and Oncology

## 2012-09-20 DIAGNOSIS — M79609 Pain in unspecified limb: Secondary | ICD-10-CM

## 2012-09-20 DIAGNOSIS — D6859 Other primary thrombophilia: Secondary | ICD-10-CM | POA: Insufficient documentation

## 2012-09-20 DIAGNOSIS — M7989 Other specified soft tissue disorders: Secondary | ICD-10-CM

## 2012-09-20 DIAGNOSIS — Z86718 Personal history of other venous thrombosis and embolism: Secondary | ICD-10-CM

## 2012-09-20 NOTE — Progress Notes (Signed)
VASCULAR LAB PRELIMINARY  PRELIMINARY  PRELIMINARY  PRELIMINARY  Right upper extremity venous duplex completed.    Preliminary report:  Right :  No evidence of DVT or superficial thrombosis.    Drue Harr, RVS 09/20/2012, 12:32 PM

## 2012-09-22 ENCOUNTER — Ambulatory Visit (HOSPITAL_COMMUNITY)
Admission: RE | Admit: 2012-09-22 | Discharge: 2012-09-22 | Disposition: A | Discharge status: Home / Self Care | Payer: BC Managed Care – PPO | Source: Ambulatory Visit | Attending: Hematology and Oncology | Admitting: Hematology and Oncology

## 2012-09-22 ENCOUNTER — Other Ambulatory Visit (INDEPENDENT_AMBULATORY_CARE_PROVIDER_SITE_OTHER): Payer: BC Managed Care – PPO

## 2012-09-22 DIAGNOSIS — D6859 Other primary thrombophilia: Secondary | ICD-10-CM

## 2012-09-22 DIAGNOSIS — R079 Chest pain, unspecified: Secondary | ICD-10-CM | POA: Insufficient documentation

## 2012-09-22 DIAGNOSIS — I471 Supraventricular tachycardia: Secondary | ICD-10-CM

## 2012-09-22 DIAGNOSIS — I498 Other specified cardiac arrhythmias: Secondary | ICD-10-CM

## 2012-09-22 DIAGNOSIS — Z01812 Encounter for preprocedural laboratory examination: Secondary | ICD-10-CM

## 2012-09-22 LAB — CBC WITH DIFFERENTIAL/PLATELET
Basophils Absolute: 0 10*3/uL (ref 0.0–0.1)
Eosinophils Absolute: 0.1 10*3/uL (ref 0.0–0.7)
HCT: 44.9 % (ref 36.0–46.0)
Hemoglobin: 14.6 g/dL (ref 12.0–15.0)
Lymphs Abs: 2.3 10*3/uL (ref 0.7–4.0)
MCHC: 32.6 g/dL (ref 30.0–36.0)
MCV: 96.1 fl (ref 78.0–100.0)
Monocytes Absolute: 0.5 10*3/uL (ref 0.1–1.0)
Monocytes Relative: 8.7 % (ref 3.0–12.0)
Neutro Abs: 3.3 10*3/uL (ref 1.4–7.7)
Platelets: 283 10*3/uL (ref 150.0–400.0)
RDW: 13.2 % (ref 11.5–14.6)

## 2012-09-22 LAB — BASIC METABOLIC PANEL
BUN: 15 mg/dL (ref 6–23)
CO2: 28 mEq/L (ref 19–32)
Chloride: 102 mEq/L (ref 96–112)
Glucose, Bld: 84 mg/dL (ref 70–99)
Potassium: 3.8 mEq/L (ref 3.5–5.1)
Sodium: 139 mEq/L (ref 135–145)

## 2012-09-22 MED ORDER — IOHEXOL 350 MG/ML SOLN
100.0000 mL | Freq: Once | INTRAVENOUS | Status: AC | PRN
  Administered 2012-09-22: 100 mL via INTRAVENOUS

## 2012-09-23 ENCOUNTER — Encounter: Payer: Self-pay | Admitting: Hematology and Oncology

## 2012-09-23 ENCOUNTER — Ambulatory Visit (HOSPITAL_BASED_OUTPATIENT_CLINIC_OR_DEPARTMENT_OTHER): Payer: BC Managed Care – PPO | Admitting: Hematology and Oncology

## 2012-09-23 VITALS — BP 122/67 | HR 74 | Temp 97.6°F | Resp 20 | Ht 64.0 in | Wt 190.3 lb

## 2012-09-23 DIAGNOSIS — Z86718 Personal history of other venous thrombosis and embolism: Secondary | ICD-10-CM

## 2012-09-23 DIAGNOSIS — D6859 Other primary thrombophilia: Secondary | ICD-10-CM

## 2012-09-23 NOTE — Progress Notes (Signed)
CC:   Windle Guard, M.D. Doylene Canning. [name], MD  IDENTIFYING STATEMENT:  The patient is a 66 year old woman who presents to discuss results.  INTERVAL HISTORY:  In summary, the patient is due to undergo ablation for supraventricular tachycardia.  She has a history of recurrent DVTs. A hypercoagulable workup performed as results, which I reviewed, was essentially unremarkable.  Please see previous initial office consult on 09/16/2012 for full details.  I had the patient proceed with a right upper extremity Doppler on September 20, 2012, which showed no evidence of deep vein or superficial thrombosis involving the right upper extremity, left subclavian vein, or left internal jugular vein.  She received a CT angiogram of the chest that revealed no evidence of pulmonary embolism or evidence of acute cardiopulmonary disease.   Additional blood results to complete the hypercoagulable panel noted D- dimer was slightly elevated at 0.84.  Factor VIII activity was also slightly elevated at around 144%.  See below for rest of results.  MEDICATIONS:  Reviewed and updated.  ALLERGIES:  Codeine, estrogen, sulfonamides.  PAST MEDICAL HISTORY/FAMILY HISTORY/SOCIAL HISTORY:  Unchanged.  REVIEW OF SYSTEMS:  Ten-point review of systems negative.  PHYSICAL EXAM:  General:  The patient is a well-appearing, well- nourished woman in no distress.  Vitals:  Pulse 74, blood pressure 122/67, temperature 97.6, respirations 20, weight 190 pounds.  HEENT: Head is atraumatic, normocephalic.  Sclerae anicteric.  Mouth moist. Chest:  Clear.  CVS:  First and second heart sounds present.  No added sounds or murmurs.  Abdomen:  Soft, nontender.  Extremities:  No calf tenderness.  LABORATORY DATA:  As above.  In addition, white cell count 7.5, hemoglobin 14.7, hematocrit 43.4, platelets 257.  Sodium 141, potassium 3.7, chloride 107, CO2 28, BUN 18, creatinine 0.9, glucose 122.  Total bilirubin 0.4, alkaline  phosphatase 67, AST 20, ALT 20, calcium 9.7. Results of Doppler and CT angiogram as above.  IMPRESSION AND PLAN:  [name] is a 66 year old woman with hypercoagulable state.  Her most recent hypercoagulable panel did indicate a low positive cardiolipin IG antibody.  In addition, D-dimer and factor VIII levels were slightly elevated.  She has a history of recurrent deep vein thromboses involving the right upper extremity and possibly the right lower extremity. She has not received anticoagulation for 12 years now.  She does not have any evidence of thrombosis at this current time.  Her blood work does indicate she is at risk of thrombosis, but placing  her on anticoagulation at this time is not indicated.  However, she was told  that because she was at some risk, she was to take precautionary measures such as avoiding estrogen-containing products.  She was also told she would be at increased risk during prolonged flights, prolonged immobilization, orthopedic casts, lower extremity fractures, prolonged illness.  So, precaution should be taken and prophylactic anticoagulation should be considered.  She was also told that if she were to have recurrent thrombosis,  anticoagulation would be indefinite. All questions were answered. She follows  up as needed.follow up as needed.    ______________________________ Laurice Record, M.D. LIO/MEDQ  D:  09/23/2012  T:  09/23/2012  Job:  [id number]

## 2012-09-23 NOTE — Progress Notes (Signed)
This office note has been dictated.

## 2012-09-23 NOTE — Patient Instructions (Addendum)
Marlyne Beards  [id number]   [facility] - AFTER VISIT SUMMARY   **RECOMMENDATIONS MADE BY THE CONSULTANT AND ANY TEST    RESULTS WILL BE SENT TO YOUR REFERRING DOCTORS.   YOUR EXAM FINDINGS, LABS AND RESULTS WERE DISCUSSED BY YOUR MD TODAY.  YOU CAN GO TO THE [facility] WEB SITE FOR INSTRUCTIONS ON HOW TO ASSESS MY CHART FOR ADDITIONAL INFORMATION AS NEEDED.  Your Updated drug allergies are: Allergies as of 09/23/2012 - Review Complete 09/23/2012  Allergen Reaction Noted  . Codeine Nausea Only   . Estrogens  09/16/2012  . Sulfonamide derivatives Rash and Other (See Comments)     Your current list of medications are: Current Outpatient Prescriptions  Medication Sig Dispense Refill  . b complex vitamins capsule Take 1 capsule by mouth daily.      . Calcium Carbonate-Vitamin D 600-400 MG-UNIT per tablet Take 1 tablet by mouth daily.      . fish oil-omega-3 fatty acids 1000 MG capsule Take 2 g by mouth daily.      Marland Kitchen lovastatin (MEVACOR) 20 MG tablet Take 20 mg by mouth at bedtime.      . Multiple Vitamin (MULTIVITAMIN) tablet Take 1 tablet by mouth daily.      . naproxen sodium (ANAPROX) 220 MG tablet Take 220 mg by mouth 2 (two) times daily with a meal.      . verapamil (COVERA HS) 180 MG (CO) 24 hr tablet Take 180 mg by mouth daily.      . Vitamin D, Ergocalciferol, (DRISDOL) 50000 UNITS CAPS Take 50,000 Units by mouth as directed. Every 2 weeks of the month on the 1st and the 15th of every month         INSTRUCTIONS GIVEN AND DISCUSSED:  See attached schedule   SPECIAL INSTRUCTIONS/FOLLOW-UP:  See above.  I acknowledge that I have been informed and understand all the instructions given to me and received a copy.I know to contact the clinic, my physician, or go to the emergency Department if any problems should occur.   I do not have any more questions at this time, but understand that I may call the [facility] at [phone] during business hours  should I have any further questions or need assistance in obtaining follow-up care.

## 2012-09-27 ENCOUNTER — Telehealth: Payer: Self-pay | Admitting: Internal Medicine

## 2012-09-27 NOTE — Telephone Encounter (Signed)
New Problem:   Patient called in wanting to speak with you to see if there were any changes in her procedure date scheduled for 09/29/12.  Please call back.

## 2012-09-27 NOTE — Telephone Encounter (Signed)
Discussed with Dr [name] and go on as scheduled

## 2012-09-29 ENCOUNTER — Ambulatory Visit (HOSPITAL_COMMUNITY)
Admission: RE | Admit: 2012-09-29 | Discharge: 2012-09-29 | Disposition: A | Discharge status: Home / Self Care | Payer: BC Managed Care – PPO | Source: Ambulatory Visit | Attending: Internal Medicine | Admitting: Internal Medicine

## 2012-09-29 ENCOUNTER — Encounter (HOSPITAL_COMMUNITY)
Admission: RE | Disposition: A | Discharge status: Home / Self Care | Payer: Self-pay | Source: Ambulatory Visit | Attending: Internal Medicine

## 2012-09-29 ENCOUNTER — Encounter (HOSPITAL_COMMUNITY): Payer: Self-pay | Admitting: General Practice

## 2012-09-29 DIAGNOSIS — K219 Gastro-esophageal reflux disease without esophagitis: Secondary | ICD-10-CM | POA: Insufficient documentation

## 2012-09-29 DIAGNOSIS — Z86718 Personal history of other venous thrombosis and embolism: Secondary | ICD-10-CM | POA: Insufficient documentation

## 2012-09-29 DIAGNOSIS — I471 Supraventricular tachycardia: Secondary | ICD-10-CM

## 2012-09-29 DIAGNOSIS — Z7982 Long term (current) use of aspirin: Secondary | ICD-10-CM | POA: Insufficient documentation

## 2012-09-29 DIAGNOSIS — I498 Other specified cardiac arrhythmias: Secondary | ICD-10-CM | POA: Insufficient documentation

## 2012-09-29 DIAGNOSIS — Z79899 Other long term (current) drug therapy: Secondary | ICD-10-CM | POA: Insufficient documentation

## 2012-09-29 DIAGNOSIS — Z7902 Long term (current) use of antithrombotics/antiplatelets: Secondary | ICD-10-CM | POA: Insufficient documentation

## 2012-09-29 HISTORY — DX: Other complications of anesthesia, initial encounter: T88.59XA

## 2012-09-29 HISTORY — DX: Other specified postprocedural states: R11.2

## 2012-09-29 HISTORY — PX: SUPRAVENTRICULAR TACHYCARDIA ABLATION: SHX6106

## 2012-09-29 HISTORY — PX: SUPRAVENTRICULAR TACHYCARDIA ABLATION: SHX5492

## 2012-09-29 HISTORY — DX: Headache: R51

## 2012-09-29 HISTORY — DX: Adverse effect of unspecified anesthetic, initial encounter: T41.45XA

## 2012-09-29 HISTORY — DX: Other specified postprocedural states: Z98.890

## 2012-09-29 LAB — PROTIME-INR: Prothrombin Time: 12.3 seconds (ref 11.6–15.2)

## 2012-09-29 SURGERY — SUPRAVENTRICULAR TACHYCARDIA ABLATION
Anesthesia: LOCAL

## 2012-09-29 MED ORDER — FENTANYL CITRATE 0.05 MG/ML IJ SOLN
INTRAMUSCULAR | Status: AC
  Filled 2012-09-29: qty 2

## 2012-09-29 MED ORDER — SODIUM CHLORIDE 0.9 % IV SOLN: 250.0000 mL | INTRAVENOUS | Status: DC | PRN

## 2012-09-29 MED ORDER — ONDANSETRON HCL 4 MG/2ML IJ SOLN: 4.0000 mg | Freq: Four times a day (QID) | INTRAMUSCULAR | Status: DC | PRN

## 2012-09-29 MED ORDER — SODIUM CHLORIDE 0.9 % IJ SOLN: 3.0000 mL | INTRAMUSCULAR | Status: DC | PRN

## 2012-09-29 MED ORDER — MIDAZOLAM HCL 5 MG/5ML IJ SOLN
INTRAMUSCULAR | Status: AC
  Filled 2012-09-29: qty 5

## 2012-09-29 MED ORDER — HYDROXYUREA 500 MG PO CAPS
ORAL_CAPSULE | ORAL | Status: AC
  Filled 2012-09-29: qty 1

## 2012-09-29 MED ORDER — DEXTROSE 5 % IV SOLN
INTRAVENOUS | Status: AC
  Filled 2012-09-29: qty 250

## 2012-09-29 MED ORDER — BUPIVACAINE HCL (PF) 0.25 % IJ SOLN
INTRAMUSCULAR | Status: AC
  Filled 2012-09-29: qty 60

## 2012-09-29 MED ORDER — ACETAMINOPHEN 325 MG PO TABS: 650.0000 mg | ORAL_TABLET | ORAL | Status: DC | PRN

## 2012-09-29 MED ORDER — SODIUM CHLORIDE 0.9 % IJ SOLN: 3.0000 mL | Freq: Two times a day (BID) | INTRAMUSCULAR | Status: DC

## 2012-09-29 NOTE — Discharge Summary (Signed)
   Discharge Summary   Patient ID: [name],  MRN: [id number], DOB/AGE: 64-Nov-1947 64 y.o.  Admit date: 09/29/2012 Discharge date: 09/29/2012  Primary Physician: Kaleen Mask, MD Primary Cardiologist: Lewayne Bunting, MD (EP)  Discharge Diagnoses Principal Problem:  *SUPRAVENTRICULAR TACHYCARDIA   Allergies Allergies  Allergen Reactions  . Codeine Nausea Only    REACTION: adverse reaction  . Estrogens     Clotting   . 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 Formal op note dictated and pending at the time of this discharge summary.  History of Present Illness/Hospital Course  Ms. [name] is a 66yo female seen by Dr. [name] as an outpatient for consideration of SVT ablation. She has a history of SVT for several years with documented heart rates up 170 beats per minute requiring several hospitalizations. She also has an extensive history of recurrent venous thromboembolism, particularly in the R arm. She has recently undergone hypercoagulable evaluation and has multiple abnormal values. She saw Dr. [name] on 08/26/12 for currently denies chest pain or shortness of breath. Despite taking verapamil, she has had recurrence of her SVT. The options were discussed with the patient including RFA, and she wished to proceed. She presented on 09/29/12 for the procedure. She was informed, consented and prepped for the procedure. There was no immediate complications. Formal operative details will be available after this discharge summary. She tolerated the procedure well, without complications. She was deemed appropriate for discharge by Dr. [name] today with resumption of outpatient medications. She will be contacted for a follow-up appointment in the office. This information, including post-RFA and supplemental SVT material, has been clearly outlined in the  discharge AVS.   Discharge Vitals:  Blood pressure 110/60, pulse 65, temperature 98.1 F (36.7 C), temperature source Oral, resp. rate 22, height 5\' 4"  (1.626 m), weight 85.73 kg (189 lb), SpO2 98.00%.   Labs:  PT 12.3 INR 0.92  Disposition:  Discharge Orders    Future Appointments: Provider: Department: Dept Phone: Center:   12/30/2012 9:15 AM Marinus Maw, MD [facility] Main [facility] [place]) [phone] [id number]     Follow-up Information    Follow up with [facility]. (Office will call you with an appointment date and time. )    Contact information:   [address] [place] [address]         Discharge Medications:    Medication List     As of 09/29/2012  7:13 PM    CONTINUE taking these medications         b complex vitamins capsule      Calcium Carbonate-Vitamin D 600-400 MG-UNIT per tablet      fish oil-omega-3 fatty acids 1000 MG capsule      lovastatin 20 MG tablet   Commonly known as: MEVACOR      multivitamin tablet      verapamil 180 MG (CO) 24 hr tablet   Commonly known as: COVERA HS      Vitamin D (Ergocalciferol) 50000 UNITS Caps   Commonly known as: DRISDOL       Outstanding Labs/Studies: None  Duration of Discharge Encounter: Greater than 30 minutes including physician time.  Signed, R. Hurman Horn, PA-C 09/29/2012, 7:13 PM

## 2012-09-29 NOTE — Op Note (Signed)
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 [id number].

## 2012-09-29 NOTE — H&P (Signed)
HPI  [name] is referred by Dr. Graciela Husbands for evaluation and consideration for catheter ablation of SVT. The patient has a history of SVT for several years with documented heart rates up 170 beats per minute. Review of her electrocardiogram demonstrates a narrow QRS tachycardia. The episodes tend to start and stop suddenly. She has had several hospitalizations for her SVT. The patient also has a history of recurrent venous thromboembolism. Her history is quite extensive with multiple blood clots particularly in the right arm. She is recently undergone anticoagulation evaluation and has multiple abnormal values. She currently denies chest pain or shortness of breath. Her last episode of SVT with several weeks ago. She has been taking calcium channel blockers most recently. Despite her medications, she has had recurrence of her SVT. She is not taking any chronic anticoagulation.  Allergies   Allergen  Reactions   .  Codeine      REACTION: adverse reaction   .  Sulfonamide Derivatives     Current Outpatient Prescriptions   Medication  Sig  Dispense  Refill   .  aspirin 81 MG tablet  Take 81 mg by mouth daily.     Marland Kitchen  b complex vitamins capsule  Take 1 capsule by mouth daily.     .  Calcium Carbonate-Vitamin D 600-400 MG-UNIT per tablet  Take 1 tablet by mouth daily.     .  fish oil-omega-3 fatty acids 1000 MG capsule  Take 2 g by mouth daily.     Marland Kitchen  lovastatin (MEVACOR) 20 MG tablet  Take 20 mg by mouth at bedtime.     .  Multiple Vitamin (MULTIVITAMIN) tablet  Take 1 tablet by mouth daily.     .  verapamil (COVERA HS) 180 MG (CO) 24 hr tablet  Take 180 mg by mouth daily.     .  Vitamin D, Ergocalciferol, (DRISDOL) 50000 UNITS CAPS  Take 50,000 Units by mouth as directed. Every 2 weeks      Past Medical History   Diagnosis  Date   .  Hx of blood clots    .  Thrombosis of arm    .  Supraventricular tachycardia    .  GERD (gastroesophageal reflux disease)    .  Bowel disease     ROS:  All  systems reviewed and negative except as noted in the HPI.  Past Surgical History   Procedure  Date   .  Cystectomy  1977     Left Breast    Family History   Problem  Relation  Age of Onset   .  Colon cancer  Mother       Late 41's to early 59's    .  Colon polyps  Mother    .  Diabetes  Mother    .  Heart disease  Mother    .  Other  Mother       Irritable Bowel Syndrome    .  Diabetes  Father    .  Heart disease  Father     History    Social History   .  Marital Status:  Married     Spouse Name:  N/A     Number of Children:  N/A   .  Years of Education:  N/A    Occupational History   .  Not on file.    Social History Main Topics   .  Smoking status:  Never Smoker   .  Smokeless tobacco:  Never Used   .  Alcohol Use:  No   .  Drug Use:  No   .  Sexually Active:     Other Topics  Concern   .  Not on file    Social History Narrative   .  No narrative on file    BP 118/71  Pulse 57  Ht 5\' 4"  (1.626 m)  Wt 190 lb (86.183 kg)  BMI 32.61 kg/m2  SpO2 98%  Physical Exam:  Well appearing 66 year old woman, NAD  HEENT: Unremarkable  Neck: No JVD, no thyromegally  Lungs: Clear with no wheezes, rales, or rhonchi.  HEART: Regular rate rhythm, no murmurs, no rubs, no clicks  Abd: soft, positive bowel sounds, no organomegally, no rebound, no guarding  Ext: 2 plus pulses, no edema, no cyanosis, no clubbing  Skin: No rashes no nodules  Neuro: CN II through XII intact, motor grossly intact  EKG normal sinus rhythm with no ventricular preexcitation.  Assess/Plan:  SUPRAVENTRICULAR TACHYCARDIA - Lewayne Bunting, MD 08/26/2012 2:09 PM Signed  I discussed the treatment options with the patient. The risk, goals, benefits, and expectations of catheter ablation of SVT have been discussed with the patient and she wishes to proceed. This be scheduled early as possible pending at time. History of venous thrombosis - Lewayne Bunting, MD 08/26/2012 2:11 PM Signed  The patient has  undergone hypercoagulable workup and I've had a chance to review several of her labs. I am not sure how to interpret her findings. The main question going forward is whether or not additional treatment to prevent venous thromboemboli will be necessary at the time of her catheter ablation. I have planned to refer the patient to the hematologist for formal recommendation. We will plan to proceed with catheter ablation based on those recommendations.  EP  Patient seen and examined again. Since prior clinic visit above, there has been no change in the history, physical exam, assessment and plan.  Leonia Reeves.D.

## 2012-09-30 ENCOUNTER — Telehealth: Payer: Self-pay | Admitting: Internal Medicine

## 2012-09-30 NOTE — Telephone Encounter (Signed)
Pt has questions and concerns regarding her medication

## 2012-09-30 NOTE — Op Note (Signed)
[name], [name]                   ACCOUNT NO.:  [id number]  MEDICAL RECORD NO.:  [id number]  LOCATION:  6529                         FACILITY:  [facility]  PHYSICIAN:  Brandy Canning. 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  INTRODUCTION:  The patient is a very pleasant 66 year old woman with a history of longstanding tachypalpitations and documented SVT.  She has been on multiple medical therapy and is now referred for catheter ablation.  PROCEDURE:  After informed was obtained, the patient was taken to the Diagnostic EP Lab in the fasting state.  After usual preparation and draping, intravenous fentanyl and midazolam was given for sedation.  A 6- [place] hexapolar catheter was inserted percutaneously in the right jugular vein and advanced to the coronary sinus.  A 6-French quadripolar catheter was inserted percutaneously in the right femoral vein and advanced to the right ventricle.  A 6-French quadripolar catheter was inserted percutaneously in the right femoral vein and advanced to the His bundle region.  After measurement of the basic intervals, the rapid ventricular pacing was carried out from the right ventricle and stepwise decreased down to 380 milliseconds where VA Wenckebach was observed. During rapid ventricular pacing, the atrial activation was midline and decremental.  Next, programmed ventricular stimulation was carried out from the right ventricle at a basic drive cycle length of 621 milliseconds.  The S1-S2 interval was stepwise decreased from 540 milliseconds down to 340 milliseconds with retrograde AV node ERP was observed.  During programmed ventricular stimulation, the atrial activation sequence was midline and decremental.  There were  no inducible SVT.  Next, programmed atrial stimulation was carried out from the atrium at base drive cycle length of 308 milliseconds.  The S1-S2 interval was stepwise decreased from 540 milliseconds down to 340 milliseconds with AV node ERP was observed.  During programmed atrial stimulation, there were multiple AH jumps and multiple echo beats, but no inducible SVT.  Next, rapid atrial pacing was carried out from the atrium at a base drive cycle length of 657 milliseconds and stepwise decreased down to 310 milliseconds where AV Wenckebach was observed. During rapid atrial pacing, the PR interval was equal to the RR interval, but there was no inducible SVT.  At this point, isoproterenol was infused at rates of 1-4 mcg per minute.  At 2 mcg per minute, rapid atrial pacing was again carried out from the atrium and pacing cycle length of 310 milliseconds, there was inducible SVT.  This was AV node reentrant tachycardia.  PVCs were placed at the time of His bundle refractoriness, which did not pre-excite the atrium and during ventricular pacing, there was a VA-AV activation sequence.  In addition, the atrial activation was midline.  As previously noted, a diagnosis of AV node reentrant tachycardia was then made.  At this point, a 7-French quadripolar ablation catheter was inserted percutaneously in the right femoral vein and advanced into the right atrium.  Mapping of the AV  node region in Koch's triangle was carried out.  A total of four RF energy applications were delivered to sites 6 through 8 in Koch's triangle resulted in prolonged accelerated junctional rhythm.  Following this, rapid atrial pacing was again carried out on isoproterenol, in that, there was no inducible SVT.  The patient was observed for approximately 30 minutes and during this time, rapid atrial pacing, programmed atrial stimulation, and rapid ventricular pacing were carried out demonstrating no inducible SVT.  The  catheter was then removed, hemostasis was assured, and the patient was returned to her room in satisfactory condition.  COMPLICATIONS:  There were no immediate procedure complications.  RESULTS:  A.  Baseline ECG:  The baseline ECG demonstrates sinus rhythm with normal axis and intervals. B.  Baseline intervals:  Sinus node cycle length was 977 milliseconds, the HV interval was 44 milliseconds, the AH interval was 79 milliseconds, and the QRS duration was 98 milliseconds. C.  Rapid ventricular pacing:  The rapid atrial pacing demonstrated a VA Wenckebach of 380 milliseconds.  The atrial activation was midline and decremental. D.  Programmed ventricular stimulation:  Programmed ventricular stimulation was carried out from the right ventricle at base drive cycle of 960 milliseconds.  The S1-S2 interval was stepwise decreased down to 340 milliseconds with a retrograde AV node ERP was observed.  During programmed ventricular stimulation, the atrial activation was midline and decremental. E.  Programmed atrial stimulation:  Programmed atrial stimulation was carried out from the atrium at base drive cycle length of 454 as well as 500 milliseconds.  The S1-S2 interval was stepwise decreased down to 340 milliseconds with AV node ERP was observed.  During programmed atrial stimulation, there were no AH jumps, no echo beats, and no inducible SVT. F.  Rapid atrial pacing:  Rapid atrial pacing was carried out from the atrium at a pacing cycle length of 600 milliseconds.  The S1-S2 interval was stepwise decreased down to 310 milliseconds where AV Wenckebach was observed.  During rapid atrial pacing, the PR interval was equal to the RR interval, and on isoproterenol, there was inducible SVT. G.  Arrhythmias observed: 1. AV node reentrant tachycardia initiation was with rapid atrial     pacing on isoproterenol.  Method of termination was with rapid     atrial pacing, cycle length was 330  milliseconds.     a.     Mapping:  Mapping of Koch's triangle demonstrated normal      size and orientation.     b.     RF energy application:  A total of four RF energy      applications delivered to sites 6 through 8 in Koch's triangle      resulted in accelerated junctional rhythm.  Following ablation,      there was no SVT.  CONCLUSIONS:  This study demonstrates successful electrophysiologic study and RF catheter ablation of AV node reentrant tachycardia with a total of four RF energy application delivered to sites 6 through 8 in Koch's triangle rendering the tachycardia noninducible.     Brandy Canning. [name], MD     GWT/MEDQ  D:  09/29/2012  T:  09/30/2012  Job:  [id number]

## 2012-09-30 NOTE — Telephone Encounter (Signed)
Continue medications as directed until f/u with Dr Johney Frame

## 2012-10-04 ENCOUNTER — Telehealth: Payer: Self-pay | Admitting: Internal Medicine

## 2012-10-04 NOTE — Telephone Encounter (Signed)
Spoke with patient and went over her not stopping her Verapamil and to continue with All medications until her f/u in Feb.  If she feels at the end of Dec that she has not had any irregularities then she will call and maybe Dr [name] will ok to hold the Verapamil at that point.  She feels it makes her sluggish

## 2012-10-04 NOTE — Telephone Encounter (Signed)
Will forward to Pablo Lawrence RN for Dr [name]

## 2012-10-04 NOTE — Telephone Encounter (Signed)
Pt was told she would get a f/u call from provider and we have not called yet so she was calling to check

## 2012-12-25 ENCOUNTER — Other Ambulatory Visit: Payer: Self-pay

## 2012-12-30 ENCOUNTER — Ambulatory Visit (INDEPENDENT_AMBULATORY_CARE_PROVIDER_SITE_OTHER): Payer: BC Managed Care – PPO | Admitting: Internal Medicine

## 2012-12-30 ENCOUNTER — Encounter: Payer: Self-pay | Admitting: Internal Medicine

## 2012-12-30 VITALS — BP 142/59 | HR 66 | Ht 64.0 in | Wt 189.4 lb

## 2012-12-30 DIAGNOSIS — I471 Supraventricular tachycardia: Secondary | ICD-10-CM

## 2012-12-30 NOTE — Assessment & Plan Note (Signed)
She has had no recurrent, symptomatic, SVT. I agree with discontinuation of her calcium channel blocker. I'll see her back on an as-needed basis. Her blood pressure is slightly elevated today. She states that at home her blood pressure is well controlled. I've asked the patient to keep a log of her blood pressures, and report back to her primary physician, Dr. Jeannetta Nap.

## 2012-12-30 NOTE — Patient Instructions (Addendum)
Your physician recommends that you schedule a follow-up appointment as needed  

## 2012-12-30 NOTE — Progress Notes (Signed)
HPI [name] returns today for followup. She is a very pleasant middle-age woman with a history of tachycardia palpitations, documented SVT, who underwent catheter ablation of AV node reentrant tachycardia a couple of months ago. In the interim, she has done well. No recurrent SVT. She had remained on verapamil, but noted fatigue. She stopped her medications 3 days ago. She denies chest pain or shortness of breath. No peripheral edema. She recently got back from a trip to [place]. Allergies  Allergen Reactions  . Codeine Nausea Only    REACTION: adverse reaction  . Estrogens     Clotting   . Sulfonamide Derivatives Rash and Other (See Comments)    Fever, fluid retention      Current Outpatient Prescriptions  Medication Sig Dispense Refill  . b complex vitamins capsule Take 1 capsule by mouth daily.      . Calcium Carbonate-Vitamin D 600-400 MG-UNIT per tablet Take 1 tablet by mouth daily.      . fish oil-omega-3 fatty acids 1000 MG capsule Take 2 g by mouth daily.      Marland Kitchen lovastatin (MEVACOR) 20 MG tablet Take 20 mg by mouth at bedtime.      . Multiple Vitamin (MULTIVITAMIN) tablet Take 1 tablet by mouth daily.      . verapamil (COVERA HS) 180 MG (CO) 24 hr tablet Take 180 mg by mouth daily.      . Vitamin D, Ergocalciferol, (DRISDOL) 50000 UNITS CAPS Take 50,000 Units by mouth as directed. Every 2 weeks of the month on the 1st and the 15th of every month       No current facility-administered medications for this visit.     Past Medical History  Diagnosis Date  . Hx of blood clots   . Thrombosis of arm   . Supraventricular tachycardia   . GERD (gastroesophageal reflux disease)   . Bowel disease   . IBS (irritable bowel syndrome)     remote history  . Complication of anesthesia     once with colonoscopy  . PONV (postoperative nausea and vomiting)   . Headache     ROS:   All systems reviewed and negative except as noted in the HPI.   Past Surgical History  Procedure  Laterality Date  . Cystectomy  1977    Left Breast   . Cyst left breast  1976  . Supraventricular tachycardia ablation  09/29/2012     Family History  Problem Relation Age of Onset  . Colon cancer Mother     Late 30's to early 5's  . Colon polyps Mother   . Diabetes Mother   . Heart disease Mother   . Other Mother     Irritable Bowel Syndrome  . Diabetes Father   . Heart disease Father      History   Social History  . Marital Status: Married    Spouse Name: N/A    Number of Children: N/A  . Years of Education: N/A   Occupational History  . Not on file.   Social History Main Topics  . Smoking status: Never Smoker   . Smokeless tobacco: Never Used  . Alcohol Use: No  . Drug Use: No  . Sexually Active:    Other Topics Concern  . Not on file   Social History Narrative  . No narrative on file     BP 142/59  Pulse 66  Ht 5\' 4"  (1.626 m)  Wt 189 lb 6.4 oz (85.911 kg)  BMI 32.49 kg/m2  Physical Exam:  Well appearing middle-age woman,NAD HEENT: Unremarkable Neck:  No JVD, no thyromegally Lungs:  Clearwith no wheezes, rales, or rhonchi. HEART:  Regular rate rhythm, no murmurs, no rubs, no clicks Abd:  soft, positive bowel sounds, no organomegally, no rebound, no guarding Ext:  2 plus pulses, no edema, no cyanosis, no clubbing Skin:  No rashes no nodules Neuro:  CN II through XII intact, motor grossly intact  EKG Normal sinus rhythm with normal axis and intervals. Poor R-wave progression.  Assess/Plan:

## 2013-01-17 ENCOUNTER — Telehealth: Payer: Self-pay | Admitting: Internal Medicine

## 2013-01-17 NOTE — Telephone Encounter (Signed)
New problem    Working in the yard on yesterday C/O heart rate was 170 . Took a verapamil.  relief after  30 minutes. Please advise.

## 2013-01-18 NOTE — Telephone Encounter (Signed)
Spoke with patient.  She had taken Musinex and Benadryl for a cold she is fighting and went outside to work in yard.  She was working dragging limbs and debris to the street when she felt her heart start racing.  She says her HR was between 140-170.  She went in a rested and after 30 min it had come down but would not go below 140. She took Verapamil 180mg  and within it was back to normal.  I have discussed with Dr [name], he says to make no changes at present and if this happens again to call and we can give her some short acting verapamil to take.  She verbalized understanding

## 2013-03-26 ENCOUNTER — Ambulatory Visit: Payer: BC Managed Care – PPO

## 2013-03-26 ENCOUNTER — Ambulatory Visit (INDEPENDENT_AMBULATORY_CARE_PROVIDER_SITE_OTHER): Payer: BC Managed Care – PPO | Admitting: Emergency Medicine

## 2013-03-26 VITALS — BP 140/70 | HR 72 | Temp 98.2°F | Resp 16 | Ht 64.0 in | Wt 192.8 lb

## 2013-03-26 DIAGNOSIS — S93409A Sprain of unspecified ligament of unspecified ankle, initial encounter: Secondary | ICD-10-CM

## 2013-03-26 DIAGNOSIS — M25579 Pain in unspecified ankle and joints of unspecified foot: Secondary | ICD-10-CM

## 2013-03-26 MED ORDER — HYDROCODONE-ACETAMINOPHEN 5-325 MG PO TABS: 1.0000 | ORAL_TABLET | ORAL | Status: DC | PRN

## 2013-03-26 MED ORDER — NAPROXEN SODIUM 550 MG PO TABS: 550.0000 mg | ORAL_TABLET | Freq: Two times a day (BID) | ORAL | Status: DC

## 2013-03-26 NOTE — Progress Notes (Signed)
[facility] [address] [place] [address] [phone]  Date:  03/26/2013   Name:  [name]   DOB:  [birth year]   MRN:  [id number]  PCP:  Kaleen Mask, MD    Chief Complaint: Ankle Injury   History of Present Illness:  [name] is a 67 y.o. very pleasant female patient who presents with the following:  Injured when she stepped in a hole while watching a soccer game.  Has pain in lateral ankle.  No pain in foot.  Accident occurred today in morning.  Moderate pain and swelling.  No improvement with over the counter medications or other home remedies. Denies other complaint or health concern today.   Patient Active Problem List   Diagnosis Date Noted  . Hypercoagulable state 09/16/2012  . History of venous thrombosis 07/22/2012  . Chest pain 07/22/2012  . DIAPHORESIS 08/09/2010  . PALPITATIONS 08/09/2010  . 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  . HEMORRHOIDS, INTERNAL 06/21/2008  . ANAL OR RECTAL PAIN 05/11/2008  . HYPERLIPIDEMIA 05/10/2008    Past Medical History  Diagnosis Date  . Hx of blood clots   . Thrombosis of arm   . Supraventricular tachycardia   . GERD (gastroesophageal reflux disease)   . Bowel disease   . IBS (irritable bowel syndrome)     remote history  . Complication of anesthesia     once with colonoscopy  . PONV (postoperative nausea and vomiting)   . Headache   . Allergy   . Clotting disorder     Past Surgical History  Procedure Laterality Date  . Cystectomy  1977    Left Breast   . Cyst left breast  1976  . Supraventricular tachycardia ablation  09/29/2012    History  Substance Use Topics  . Smoking status: Never Smoker   . Smokeless tobacco: Never Used  . Alcohol Use: No    Family History  Problem Relation Age of Onset  . Colon cancer Mother     Late 62's to early 51's  . Colon polyps Mother   . Diabetes Mother   . Heart disease Mother   . Other Mother     Irritable Bowel Syndrome  .  Diabetes Father   . Heart disease Father     Allergies  Allergen Reactions  . Codeine Nausea Only    REACTION: adverse reaction  . Estrogens     Clotting   . Sulfonamide Derivatives Rash and Other (See Comments)    Fever, fluid retention     Medication list has been reviewed and updated.  Current Outpatient Prescriptions on File Prior to Visit  Medication Sig Dispense Refill  . lovastatin (MEVACOR) 20 MG tablet Take 20 mg by mouth at bedtime.      . Multiple Vitamin (MULTIVITAMIN) tablet Take 1 tablet by mouth daily.      . Vitamin D, Ergocalciferol, (DRISDOL) 50000 UNITS CAPS Take 50,000 Units by mouth as directed. Every 2 weeks of the month on the 1st and the 15th of every month      . b complex vitamins capsule Take 1 capsule by mouth daily.      . Calcium Carbonate-Vitamin D 600-400 MG-UNIT per tablet Take 1 tablet by mouth daily.      . fish oil-omega-3 fatty acids 1000 MG capsule Take 2 g by mouth daily.      . verapamil (COVERA HS) 180 MG (CO) 24 hr tablet Take 180 mg by mouth daily.  No current facility-administered medications on file prior to visit.    Review of Systems:  As per HPI, otherwise negative.    Physical Examination: Filed Vitals:   03/26/13 1510  BP: 140/70  Pulse: 72  Temp: 98.2 F (36.8 C)  Resp: 16   Filed Vitals:   03/26/13 1510  Height: 5\' 4"  (1.626 m)  Weight: 192 lb 12.8 oz (87.454 kg)   Body mass index is 33.08 kg/(m^2). Ideal Body Weight: Weight in (lb) to have BMI = 25: 145.3   GEN: WDWN, NAD, Non-toxic, Alert & Oriented x 3 HEENT: Atraumatic, Normocephalic.  Ears and Nose: No external deformity. EXTR: No clubbing/cyanosis/edema NEURO: Normal gait.  PSYCH: Normally interactive. Conversant. Not depressed or anxious appearing.  Calm demeanor.  Left ankle:  Moderate swelling lateral malleolus.  No deformity or ecchymosis   Assessment and Plan: Sprain ankle Boot vicodin Anaprox RICE Follow up in one week   Signed,   Phillips Odor, MD  [facility] reading (PRIMARY) by  Dr. Dareen Piano.  Old fracture. No acute bony injury.

## 2013-04-27 ENCOUNTER — Encounter: Payer: Self-pay | Admitting: Internal Medicine

## 2013-04-28 ENCOUNTER — Telehealth: Payer: Self-pay | Admitting: Internal Medicine

## 2013-04-28 ENCOUNTER — Ambulatory Visit (INDEPENDENT_AMBULATORY_CARE_PROVIDER_SITE_OTHER): Payer: BC Managed Care – PPO | Admitting: Internal Medicine

## 2013-04-28 ENCOUNTER — Ambulatory Visit: Payer: BC Managed Care – PPO | Admitting: Internal Medicine

## 2013-04-28 VITALS — BP 92/60 | HR 152 | Ht 64.0 in | Wt 189.1 lb

## 2013-04-28 DIAGNOSIS — I498 Other specified cardiac arrhythmias: Secondary | ICD-10-CM

## 2013-04-28 DIAGNOSIS — I471 Supraventricular tachycardia: Secondary | ICD-10-CM

## 2013-04-28 MED ORDER — METOPROLOL TARTRATE 5 MG/5ML IV SOLN
5.0000 mg | INTRAVENOUS | Status: AC | PRN
  Administered 2013-04-28 (×2): 5 mg via INTRAVENOUS

## 2013-04-28 MED ORDER — NADOLOL 20 MG PO TABS: 20.0000 mg | ORAL_TABLET | Freq: Every day | ORAL | Status: DC

## 2013-04-28 MED ORDER — ADENOSINE 6 MG/2ML IV SOLN
6.0000 mg | Freq: Once | INTRAVENOUS | Status: AC
  Administered 2013-04-28: 6 mg via INTRAVENOUS

## 2013-04-28 NOTE — Telephone Encounter (Signed)
New Problem  Pt states her heart is beating fast.  She said at 8 am it was 161 per min, took 80 mg of verapamil at 9 am and her heart rate is still around 131.

## 2013-04-28 NOTE — Patient Instructions (Addendum)
Your physician recommends that you schedule a follow-up appointment   On 05-04-14 at 12:00   WITH DR [name]  Your physician has recommended you make the following change in your medication: START NADOLOL 20 MG  1 TAB EVERY DAY

## 2013-04-28 NOTE — Progress Notes (Signed)
Pt came into the office today with complain of increased HR and feeling poorly.  After placing her on the monitor and seeing a rhythm of SVT and rate of around 153 bpm she was evaluated by Dr Hillis Range.  BP was 92/60. Dr Johney Frame verbally ordered by to have a IV placed.  First attempt into right forearm with a 20 g a/c unsuccessful.  successfully placed a 22 g a/c into left hand NS running at [facility]  Pt was given Adenosine 6 mg IV and returned to NSR with a rate of appr 70 bpm.  Only stayed in this rhythm for about 30 seconds and gradually returned to SVT.   Dr Johney Frame then ordered pt to have Metoprolol 5 mg/5 ml IV and again 5 mins later  BP 90/60 HR 71 after first dose/ 94/62 HR 70 after 2nd dose.  These two doses of Metoprolol were given as ordered.  Pt tolerated well without complaints.  HR and rhythm returned to NSR with a rate of appr 70 bpm.  Pt was monitored until 4:45 pm until after her BP remained at 98/62 HR 70.  Her IV was removed intact and without difficulty.  She left the office in good spirits with her husband, daughter and grandson.  Husband was going to be driving her home.  She was given d/c instructions and a follow up appt before leaving.

## 2013-04-28 NOTE — Telephone Encounter (Signed)
Spoke with pt, she had a bout of fast heart rate last night and it went away. Then this am at 8 am her heart rate was 160 with bp 144/85. After one hour she took verapamil 80 mg. By 11 am her bp had gotten down to 87/66 and she felt faint, that is when she decided to call. At present she is felling better after drinking a lot of water. Her pulse at this time is 140 with bp 100/70. She took another 1/2 dose of verapamil. She will come to the office for EKG, her dtr will bring her.

## 2013-05-01 NOTE — Progress Notes (Signed)
PCP:  Kaleen Mask, MD Primary EP:  Dr [name]  The patient presents today for urgent electrophysiology followup. She previously underwent SVT ablation by Dr [name].  She reports recent return of symptomatic SVT.  Today, she developed abrupt onset of tachypalpitations.  She presented to our office in sustained narrow complex SVT.  She reports symptoms of palpitations and fatigue.  Today, she denies symptoms of chest pain, shortness of breath, orthopnea, PND, lower extremity edema, dizziness, presyncope, syncope, or neurologic sequela.  The patient feels that she is tolerating medications without difficulties and is otherwise without complaint today.   Past Medical History  Diagnosis Date  . Hx of blood clots   . Thrombosis of arm   . Supraventricular tachycardia   . GERD (gastroesophageal reflux disease)   . Bowel disease   . IBS (irritable bowel syndrome)     remote history  . Complication of anesthesia     once with colonoscopy  . PONV (postoperative nausea and vomiting)   . Headache(784.0)   . Allergy   . Clotting disorder    Past Surgical History  Procedure Laterality Date  . Cystectomy  1977    Left Breast   . Cyst left breast  1976  . Supraventricular tachycardia ablation  09/29/2012    Current Outpatient Prescriptions  Medication Sig Dispense Refill  . b complex vitamins capsule Take 1 capsule by mouth daily.      . Calcium Carbonate-Vitamin D 600-400 MG-UNIT per tablet Take 1 tablet by mouth daily.      Marland Kitchen co-enzyme Q-10 30 MG capsule Take 30 mg by mouth 3 (three) times daily.      Marland Kitchen HYDROcodone-acetaminophen (NORCO) 5-325 MG per tablet Take 1-2 tablets by mouth every 4 (four) hours as needed for pain.  30 tablet  0  . lovastatin (MEVACOR) 20 MG tablet Take 20 mg by mouth at bedtime.      . Multiple Vitamin (MULTIVITAMIN) tablet Take 1 tablet by mouth daily.      . Vitamin D, Ergocalciferol, (DRISDOL) 50000 UNITS CAPS Take 50,000 Units by mouth as directed.  Every 2 weeks of the month on the 1st and the 15th of every month      . nadolol (CORGARD) 20 MG tablet Take 1 tablet (20 mg total) by mouth daily.  30 tablet  6   No current facility-administered medications for this visit.    Allergies  Allergen Reactions  . Codeine Nausea Only    REACTION: adverse reaction  . Estrogens     Clotting   . Sulfonamide Derivatives Rash and Other (See Comments)    Fever, fluid retention     History   Social History  . Marital Status: Married    Spouse Name: N/A    Number of Children: N/A  . Years of Education: N/A   Occupational History  . Not on file.   Social History Main Topics  . Smoking status: Never Smoker   . Smokeless tobacco: Never Used  . Alcohol Use: No  . Drug Use: No  . Sexually Active: Yes    Birth Control/ Protection: None   Other Topics Concern  . Not on file   Social History Narrative  . No narrative on file    Family History  Problem Relation Age of Onset  . Colon cancer Mother     Late 45's to early 40's  . Colon polyps Mother   . Diabetes Mother   . Heart disease Mother   .  Other Mother     Irritable Bowel Syndrome  . Diabetes Father   . Heart disease Father     ROS-  All systems are reviewed and are negative except as outlined in the HPI above  Physical Exam: Filed Vitals:   04/28/13 1503  BP: 92/60  Pulse: 152  Height: 5\' 4"  (1.626 m)  Weight: 189 lb 1.9 oz (85.784 kg)    GEN- The patient is well appearing, alert and oriented x 3 today.   Head- normocephalic, atraumatic Eyes-  Sclera clear, conjunctiva pink Ears- hearing intact Oropharynx- clear Neck- supple, no JVP Lymph- no cervical lymphadenopathy Lungs- Clear to ausculation bilaterally, normal work of breathing Heart- rapid regular pulse GI- soft, NT, ND, + BS Extremities- no clubbing, cyanosis, or edema MS- no significant deformity or atrophy Skin- no rash or lesion Psych- euthymic mood, full affect Neuro- strength and sensation  are intact  ekg today reveals short RP SVT  Dr Bruna Potter EP procedure note and his office notes are reviewed at length today  Assessment and Plan:  1. SVT The patient presents with recurrent SVT post ablation.  This is short RP tachycardia which did not terminate with carotid massage by me or vagal maneuvers performed by the patient.  I therefore administered 6mg  IV adenosine in the office which quickly terminated the tachycardia.  Unfortunately, it returned within about a minute and was again sustained.  I therefore administered IV lopressor with termination of tachycardia.  She has not well tolerated medical therapy for her SVT in the past.  I will start nadolol 20mg  daily today though I suspect that repeat catheter ablation will be the best strategy for her.  I will schedule her to follow-up with Dr [name] next week to discuss repeat catheter ablation.

## 2013-05-04 ENCOUNTER — Encounter: Payer: Self-pay | Admitting: Internal Medicine

## 2013-05-04 ENCOUNTER — Ambulatory Visit (INDEPENDENT_AMBULATORY_CARE_PROVIDER_SITE_OTHER): Payer: BC Managed Care – PPO | Admitting: Internal Medicine

## 2013-05-04 VITALS — BP 133/76 | HR 58 | Ht 64.0 in | Wt 190.0 lb

## 2013-05-04 DIAGNOSIS — I498 Other specified cardiac arrhythmias: Secondary | ICD-10-CM

## 2013-05-04 NOTE — Assessment & Plan Note (Signed)
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 We discussed the treatment options in detail. Redo catheter ablation as well as beta blocker therapy were discussed. She will continue her beta blocker therapy for now. If she has recurrent SVT, or becomes intolerant of beta blockers, I would recommend catheter ablation. We did discuss the increased risk for the development of heart block if she decides to proceed with catheter ablation.

## 2013-05-04 NOTE — Patient Instructions (Addendum)
Your physician wants you to follow-up in: 4 months with Dr. Taylor.  You will receive a reminder letter in the mail two months in advance. If you don't receive a letter, please call our office to schedule the follow-up appointment.   

## 2013-05-04 NOTE — Progress Notes (Signed)
HPI [name] returns today for followup. She is a very pleasant 67 year old woman with a history of SVT, status post catheter ablation of AV node reentrant tachycardia approximately 9 months ago. The patient initially did very well but had recurrent SVT develop several weeks ago. She was found in the office to have a narrow QRS tachycardia at 150 beats per minute. It was terminated with adenosine. She was placed on a beta blocker and returns today for followup. She denies syncope. When she goes into SVT, she feels chest pressure. Allergies  Allergen Reactions  . Codeine Nausea Only    REACTION: adverse reaction  . Estrogens     Clotting   . Sulfonamide Derivatives Rash and Other (See Comments)    Fever, fluid retention      Current Outpatient Prescriptions  Medication Sig Dispense Refill  . Coenzyme Q10 (CO Q-10) 100 MG CAPS Take by mouth daily.      Marland Kitchen lovastatin (MEVACOR) 20 MG tablet Take 20 mg by mouth at bedtime.      . Multiple Vitamin (MULTIVITAMIN) tablet Take 1 tablet by mouth daily.      . nadolol (CORGARD) 20 MG tablet Take 1 tablet (20 mg total) by mouth daily.  30 tablet  6  . Vitamin D, Ergocalciferol, (DRISDOL) 50000 UNITS CAPS Take 50,000 Units by mouth as directed. Every 2 weeks of the month on the 1st and the 15th of every month       No current facility-administered medications for this visit.     Past Medical History  Diagnosis Date  . Hx of blood clots   . Thrombosis of arm   . Supraventricular tachycardia   . GERD (gastroesophageal reflux disease)   . Bowel disease   . IBS (irritable bowel syndrome)     remote history  . Complication of anesthesia     once with colonoscopy  . PONV (postoperative nausea and vomiting)   . Headache(784.0)   . Allergy   . Clotting disorder     ROS:   All systems reviewed and negative except as noted in the HPI.   Past Surgical History  Procedure Laterality Date  . Cystectomy  1977    Left Breast   . Cyst left  breast  1976  . Supraventricular tachycardia ablation  09/29/2012     Family History  Problem Relation Age of Onset  . Colon cancer Mother     Late 55's to early 78's  . Colon polyps Mother   . Diabetes Mother   . Heart disease Mother   . Other Mother     Irritable Bowel Syndrome  . Diabetes Father   . Heart disease Father      History   Social History  . Marital Status: Married    Spouse Name: N/A    Number of Children: N/A  . Years of Education: N/A   Occupational History  . Not on file.   Social History Main Topics  . Smoking status: Never Smoker   . Smokeless tobacco: Never Used  . Alcohol Use: No  . Drug Use: No  . Sexually Active: Yes    Birth Control/ Protection: None   Other Topics Concern  . Not on file   Social History Narrative  . No narrative on file     BP 133/76  Pulse 58  Ht 5\' 4"  (1.626 m)  Wt 190 lb (86.183 kg)  BMI 32.6 kg/m2  Physical Exam:  Well appearing obese, middle-age woman,NAD  HEENT: Unremarkable Neck:  6 cm JVD, no thyromegally Back:  No CVA tenderness Lungs:  Clear with no wheezes, rales, or rhonchi. HEART:  Regular rate rhythm, no murmurs, no rubs, no clicks Abd:  soft, positive bowel sounds, no organomegally, no rebound, no guarding Ext:  2 plus pulses, no edema, no cyanosis, no clubbing Skin:  No rashes no nodules Neuro:  CN II through XII intact, motor grossly intact   Assess/Plan:

## 2013-05-09 ENCOUNTER — Other Ambulatory Visit: Payer: Self-pay

## 2013-05-09 DIAGNOSIS — Z1231 Encounter for screening mammogram for malignant neoplasm of breast: Secondary | ICD-10-CM

## 2013-05-10 ENCOUNTER — Other Ambulatory Visit: Payer: Self-pay

## 2013-05-10 DIAGNOSIS — Z780 Asymptomatic menopausal state: Secondary | ICD-10-CM

## 2013-05-25 ENCOUNTER — Encounter: Payer: Self-pay | Admitting: Internal Medicine

## 2013-06-21 ENCOUNTER — Ambulatory Visit
Admission: RE | Admit: 2013-06-21 | Discharge: 2013-06-21 | Disposition: A | Discharge status: Home / Self Care | Payer: BC Managed Care – PPO | Source: Ambulatory Visit

## 2013-06-21 DIAGNOSIS — Z1231 Encounter for screening mammogram for malignant neoplasm of breast: Secondary | ICD-10-CM

## 2013-06-21 DIAGNOSIS — Z780 Asymptomatic menopausal state: Secondary | ICD-10-CM

## 2013-07-22 ENCOUNTER — Telehealth: Payer: Self-pay | Admitting: *Deleted

## 2013-07-22 ENCOUNTER — Ambulatory Visit (AMBULATORY_SURGERY_CENTER): Payer: Self-pay | Admitting: *Deleted

## 2013-07-22 VITALS — Ht 64.5 in | Wt 190.2 lb

## 2013-07-22 DIAGNOSIS — Z800 Family history of malignant neoplasm of digestive organs: Secondary | ICD-10-CM

## 2013-07-22 MED ORDER — MOVIPREP 100 G PO SOLR: 1.0000 | Freq: Once | ORAL | Status: DC

## 2013-07-22 NOTE — Telephone Encounter (Signed)
April,  This patient is cleared for her procedure at [facility].  Thanks,  Cathlyn Parsons CRNA,MS

## 2013-07-22 NOTE — Progress Notes (Signed)
No egg or soy allergy. No anesthesia problems.  

## 2013-07-22 NOTE — Telephone Encounter (Signed)
[name]- pt has recurrent SVT, pt has had an ablation 09/29/2012 that was unsuccessful, on  04/28/13 she was seen in Cardio office for episode of SVT, she was given adenosine IV and metoprolol IV twice to bring HR rate down, she was then placed on nadolol to help maintain HR. Is pt ok for [facility]?-adm

## 2013-07-26 ENCOUNTER — Encounter: Payer: Self-pay | Admitting: Internal Medicine

## 2013-08-05 ENCOUNTER — Encounter: Payer: Self-pay | Admitting: Internal Medicine

## 2013-08-05 ENCOUNTER — Ambulatory Visit (AMBULATORY_SURGERY_CENTER): Payer: Medicare Other | Admitting: Internal Medicine

## 2013-08-05 VITALS — BP 150/70 | HR 69 | Temp 98.3°F | Resp 25 | Ht 64.5 in | Wt 190.0 lb

## 2013-08-05 DIAGNOSIS — Z800 Family history of malignant neoplasm of digestive organs: Secondary | ICD-10-CM

## 2013-08-05 DIAGNOSIS — D126 Benign neoplasm of colon, unspecified: Secondary | ICD-10-CM

## 2013-08-05 DIAGNOSIS — Z1211 Encounter for screening for malignant neoplasm of colon: Secondary | ICD-10-CM

## 2013-08-05 MED ORDER — SODIUM CHLORIDE 0.9 % IV SOLN: 500.0000 mL | INTRAVENOUS | Status: DC

## 2013-08-05 NOTE — Progress Notes (Signed)
Patient did not experience any of the following events: a burn prior to discharge; a fall within the facility; wrong site/side/patient/procedure/implant event; or a hospital transfer or hospital admission upon discharge from the facility. (G8907)Patient did not have preoperative order for IV antibiotic SSI prophylaxis. (G8918) ewm 

## 2013-08-05 NOTE — Progress Notes (Signed)
Called to room to assist during endoscopic procedure.  Patient ID and intended procedure confirmed with present staff. Received instructions for my participation in the procedure from the performing physician.  

## 2013-08-05 NOTE — Op Note (Signed)
 [facility] [address] [place], [address]   COLONOSCOPY PROCEDURE REPORT  PATIENT: [name], [name]  MR#: [id number] BIRTHDATE: [birth year] , 67  yrs. old GENDER: Female ENDOSCOPIST: Hart Carwin, MD REFERRED XB:JYNWGN Jeannetta Nap, M.D. PROCEDURE DATE:  08/05/2013 PROCEDURE:   Colonoscopy with cold biopsy polypectomy First Screening Colonoscopy - Avg.  risk and is 50 yrs.  old or older - No.  Prior Negative Screening - Now for repeat screening. Above average risk  History of Adenoma - Now for follow-up colonoscopy & has been > or = to 3 yrs.  N/A  Polyps Removed Today? Yes. ASA CLASS:   Class II INDICATIONS:Patient's immediate family history of colon cancer.( mother), MEDICATIONS: MAC sedation, administered by CRNA and propofol (Diprivan) 300mg  IV  DESCRIPTION OF PROCEDURE:   After the risks benefits and alternatives of the procedure were thoroughly explained, informed consent was obtained.  A digital rectal exam revealed no abnormalities of the rectum.   The     endoscope was introduced through the anus and advanced to the cecum, which was identified by both the appendix and ileocecal valve. No adverse events experienced.   The quality of the prep was excellent, using MoviPrep  The instrument was then slowly withdrawn as the colon was fully examined.      COLON FINDINGS: A smooth sessile polyp ranging between 3-42mm in size was found in the sigmoid colon.  A polypectomy was performed with cold forceps.  The resection was complete and the polyp tissue was completely retrieved.   Small external hemorrhoids were found. Retroflexed views revealed no abnormalities. The time to cecum=9 minutes 7 seconds.  Withdrawal time=6 minutes 10 seconds.  The scope was withdrawn and the procedure completed. COMPLICATIONS: There were no complications.  ENDOSCOPIC IMPRESSION: 1.   Sessile polyp ranging between 3-54mm in size was found in the sigmoid colon; polypectomy was  performed with cold forceps 2.   Small external hemorrhoids  RECOMMENDATIONS: 1.  Await pathology results 2.  High fiber diet 3.   rdecall colon 5 years   eSigned:  Hart Carwin, MD 08/05/2013 8:36 AM   cc:   PATIENT NAME:  Karolyne, Timmons MR#: [id number]

## 2013-08-05 NOTE — Progress Notes (Signed)
Procedure ends, to recovery, report given and VSS. 

## 2013-08-05 NOTE — Patient Instructions (Addendum)
YOU HAD AN ENDOSCOPIC PROCEDURE TODAY AT THE [facility]: Refer to the procedure report that was given to you for any specific questions about what was found during the examination.  If the procedure report does not answer your questions, please call your gastroenterologist to clarify.  If you requested that your care partner not be given the details of your procedure findings, then the procedure report has been included in a sealed envelope for you to review at your convenience later.  YOU SHOULD EXPECT: Some feelings of bloating in the abdomen. Passage of more gas than usual.  Walking can help get rid of the air that was put into your GI tract during the procedure and reduce the bloating. If you had a lower endoscopy (such as a colonoscopy or flexible sigmoidoscopy) you may notice spotting of blood in your stool or on the toilet paper. If you underwent a bowel prep for your procedure, then you may not have a normal bowel movement for a few days.  DIET: Your first meal following the procedure should be a light meal and then it is ok to progress to your normal diet.  A half-sandwich or bowl of soup is an example of a good first meal.  Heavy or fried foods are harder to digest and may make you feel nauseous or bloated.  Likewise meals heavy in dairy and vegetables can cause extra gas to form and this can also increase the bloating.  Drink plenty of fluids but you should avoid alcoholic beverages for 24 hours.  ACTIVITY: Your care partner should take you home directly after the procedure.  You should plan to take it easy, moving slowly for the rest of the day.  You can resume normal activity the day after the procedure however you should NOT DRIVE or use heavy machinery for 24 hours (because of the sedation medicines used during the test).    SYMPTOMS TO REPORT IMMEDIATELY: A gastroenterologist can be reached at any hour.  During normal business hours, 8:30 AM to 5:00 PM Monday through Friday,  call [phone].  After hours and on weekends, please call the GI answering service at [phone] who will take a message and have the physician on call contact you.   Following lower endoscopy (colonoscopy or flexible sigmoidoscopy):  Excessive amounts of blood in the stool  Significant tenderness or worsening of abdominal pains  Swelling of the abdomen that is new, acute  Fever of 100F or higher    FOLLOW UP: If any biopsies were taken you will be contacted by phone or by letter within the next 1-3 weeks.  Call your gastroenterologist if you have not heard about the biopsies in 3 weeks.  Our staff will call the home number listed on your records the next business day following your procedure to check on you and address any questions or concerns that you may have at that time regarding the information given to you following your procedure. This is a courtesy call and so if there is no answer at the home number and we have not heard from you through the emergency physician on call, we will assume that you have returned to your regular daily activities without incident.  SIGNATURES/CONFIDENTIALITY: You and/or your care partner have signed paperwork which will be entered into your electronic medical record.  These signatures attest to the fact that that the information above on your After Visit Summary has been reviewed and is understood.  Full responsibility of the confidentiality   of this discharge information lies with you and/or your care-partner.     

## 2013-08-08 ENCOUNTER — Telehealth: Payer: Self-pay | Admitting: *Deleted

## 2013-08-08 NOTE — Telephone Encounter (Signed)
  Follow up Call-  Call back number 08/05/2013  Post procedure Call Back phone  # [phone]  Permission to leave phone message Yes     Patient questions:  Do you have a fever, pain , or abdominal swelling? no Pain Score  0 *  Have you tolerated food without any problems? yes  Have you been able to return to your normal activities? yes  Do you have any questions about your discharge instructions: Diet   no Medications  no Follow up visit  no  Do you have questions or concerns about your Care? no  Actions: * If pain score is 4 or above: No action needed, pain <4.

## 2013-08-09 ENCOUNTER — Encounter: Payer: Self-pay | Admitting: Internal Medicine

## 2013-08-19 ENCOUNTER — Telehealth: Payer: Self-pay | Admitting: Internal Medicine

## 2013-08-19 NOTE — Telephone Encounter (Signed)
New Problem     Pt has a question about NADOLOL 20 mg.   Pt's BP has been lowe 99/70 last week 3 day running.   Pt stopped taking med 3 day now.   Pt would like to know if she can take sup for weight GARCINIA &  CANBOGIA extract    Thanks!

## 2013-08-19 NOTE — Telephone Encounter (Signed)
Called patient and spoke with her.  She states she was concerned with her BP of 99/70 and taking the Nadolol.  She says she felt washed out and drank some caffeine and her BP returned to normal.  I explained to her that she needed to make sure she stayed hydrated and continue the Nadolol.  As far as diet pills, these are all OTC and I advised her to try and eat 5 small meals daily consisting of fresh fruits and vegetables and try to exercise 3 times per week. She verbalized understanding and agrees with plan

## 2013-09-15 ENCOUNTER — Other Ambulatory Visit: Payer: Self-pay

## 2013-10-10 ENCOUNTER — Telehealth: Payer: Self-pay | Admitting: Internal Medicine

## 2013-10-10 NOTE — Telephone Encounter (Signed)
New problem    C/o SVT now .  B/p today  130/101.151 beat per min.  Took 2 nadolol tablet  20 mg each. Drop back down 64 beat per min . 100/70 .

## 2013-10-10 NOTE — Telephone Encounter (Signed)
Spoke with patient and she is back in NSR now.  She is going to continue all her medications and I have scheduled her a follow up.  This is the first episode since her office visit

## 2013-11-18 ENCOUNTER — Ambulatory Visit (INDEPENDENT_AMBULATORY_CARE_PROVIDER_SITE_OTHER): Payer: Medicare Other | Admitting: Internal Medicine

## 2013-11-18 ENCOUNTER — Encounter: Payer: Self-pay | Admitting: Internal Medicine

## 2013-11-18 VITALS — BP 122/76 | HR 57 | Ht 64.0 in | Wt 190.0 lb

## 2013-11-18 DIAGNOSIS — R002 Palpitations: Secondary | ICD-10-CM

## 2013-11-18 DIAGNOSIS — I498 Other specified cardiac arrhythmias: Secondary | ICD-10-CM

## 2013-11-18 NOTE — Progress Notes (Signed)
HPI [name] returns today for followup. 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 She has been treated with Nadolol and done well.  She notes 2 episodes of SVT in the last 6 months. Both were controlled with medications and did not require emergency room evaluation. She is frustrated by her inability to lose weight but is otherwise been stable. She is exercising a couple of times a week. Allergies  Allergen Reactions  . Codeine Nausea Only    REACTION: adverse reaction  . Estrogens     Clotting   . Sulfonamide Derivatives Rash and Other (See Comments)    Fever, fluid retention      Current Outpatient Prescriptions  Medication Sig Dispense Refill  . aspirin 81 MG tablet Take 81 mg by mouth daily.      . Coenzyme Q10 (CO Q-10) 100 MG CAPS Take by mouth daily.      Marland Kitchen lovastatin (MEVACOR) 20 MG tablet Take 20 mg by mouth at bedtime.      . Multiple Vitamin (MULTIVITAMIN) tablet Take 1 tablet by mouth daily.      . nadolol (CORGARD) 20 MG tablet Take 1 tablet (20 mg total) by mouth daily.  30 tablet  6  . Vitamin D, Ergocalciferol, (DRISDOL) 50000 UNITS CAPS Take 50,000 Units by mouth as directed. Every 2 weeks of the month on the 1st and the 15th of every month       No current facility-administered medications for this visit.     Past Medical History  Diagnosis Date  . Hx of blood clots   . Thrombosis of arm   . Supraventricular tachycardia   . GERD (gastroesophageal reflux disease)   . Bowel disease   . IBS (irritable bowel syndrome)     remote history  . Complication of anesthesia     once with colonoscopy  . PONV (postoperative nausea and vomiting)   . Headache(784.0)   . Allergy   . Clotting disorder     ROS:   All systems reviewed and negative except as noted in the HPI.   Past Surgical History  Procedure Laterality Date  . Cystectomy  1977    Left Breast   . Cyst left breast   1976  . Supraventricular tachycardia ablation  09/29/2012    unsuccessful     Family History  Problem Relation Age of Onset  . Colon cancer Mother     Late 56's to early 7's  . Colon polyps Mother   . Diabetes Mother   . Heart disease Mother   . Other Mother     Irritable Bowel Syndrome  . Diabetes Father   . Heart disease Father      History   Social History  . Marital Status: Married    Spouse Name: N/A    Number of Children: N/A  . Years of Education: N/A   Occupational History  . Not on file.   Social History Main Topics  . Smoking status: Never Smoker   . Smokeless tobacco: Never Used  . Alcohol Use: Yes     Comment: occasional 1 glass of wine 1-2 tims a month  . Drug Use: No  . Sexual Activity: Yes    Birth Control/ Protection: None   Other Topics Concern  . Not on file   Social History Narrative  . No narrative on file     BP  122/76  Pulse 57  Ht 5\' 4"  (1.626 m)  Wt 190 lb (86.183 kg)  BMI 32.60 kg/m2  Physical Exam:  Well appearing 68 year old woman,NAD HEENT: Unremarkable Neck:  No JVD, no thyromegally Back:  No CVA tenderness Lungs:  Clear with no wheezes, rales, or rhonchi. HEART:  Regular rate rhythm, no murmurs, no rubs, no clicks Abd:  soft, positive bowel sounds, no organomegally, no rebound, no guarding Ext:  2 plus pulses, no edema, no cyanosis, no clubbing Skin:  No rashes no nodules Neuro:  CN II through XII intact, motor grossly intact  EKG - sinus bradycardia with poor R-wave progression    Assess/Plan:

## 2013-11-18 NOTE — Assessment & Plan Note (Signed)
The patient's symptoms have been fairly well-controlled. She will continue her beta blocker. She has not had side effects from this medication. She will continue a period of watchful waiting.

## 2013-11-18 NOTE — Patient Instructions (Signed)
Your physician wants you to follow-up in: [place] will receive a reminder letter in the mail two months in advance. If you don't receive a letter, please call our office to schedule the follow-up appointment.

## 2013-12-28 ENCOUNTER — Other Ambulatory Visit: Payer: Self-pay | Admitting: Cardiology

## 2014-02-28 ENCOUNTER — Ambulatory Visit: Payer: Medicare Other

## 2014-02-28 ENCOUNTER — Telehealth: Payer: Self-pay | Admitting: Internal Medicine

## 2014-02-28 VITALS — BP 104/74 | HR 68 | Ht 64.0 in | Wt 190.5 lb

## 2014-02-28 DIAGNOSIS — R002 Palpitations: Secondary | ICD-10-CM

## 2014-02-28 NOTE — Patient Instructions (Signed)
Continue same medications  May take a extra Corgard 20 mg if needed for fast heart beat   Advised to call Claiborne Billings if continues to have episodes of fast heart beat.

## 2014-02-28 NOTE — Progress Notes (Signed)
1.) Reason for visit: EKG  2.) Name of MD requesting visit: Dr.Taylor  3.) H&P: Patient had episode of fast heart beat this morning started at 9:00 am lasted until 1:00 pm.  4.) ROS related to problem: Patient took a extra 20 mg Corgard at 9:00 am because of fast beat.Currently complains of fatigue.  5.) Assessment and plan per MD: Dr.Taylor reviewed EKG which revealed normal sinus rhythm rate 68. Continue same medications may take extra Corgard 20 mg if needed for fast heart beat.Advised to call Dr.Taylor's nurse Claiborne Billings if continues to have episodes of fast heart beat.  6.) Provider sign-of(MD statement):

## 2014-02-28 NOTE — Telephone Encounter (Signed)
New message     About 8:30-9:00 heart was racing.  Pt took 2 nadaolol. Now heart rate is 133, bp is 120/90.  Please advise.

## 2014-02-28 NOTE — Telephone Encounter (Signed)
See note for nurse room visit

## 2014-02-28 NOTE — Telephone Encounter (Signed)
Spoke with patient and she says she was cleaning house and her HR was 150 and BP  148/120  Feels lightheaded Discussed with Dr Lovena Le  He wants her to come in for an EKG.  She is going to have her daughter drive her

## 2014-04-15 ENCOUNTER — Encounter (HOSPITAL_COMMUNITY): Payer: Self-pay | Admitting: Emergency Medicine

## 2014-04-15 ENCOUNTER — Emergency Department (HOSPITAL_COMMUNITY)
Admission: EM | Admit: 2014-04-15 | Discharge: 2014-04-15 | Disposition: A | Discharge status: Home / Self Care | Payer: Medicare Other | Attending: Emergency Medicine | Admitting: Emergency Medicine

## 2014-04-15 DIAGNOSIS — Z86718 Personal history of other venous thrombosis and embolism: Secondary | ICD-10-CM | POA: Insufficient documentation

## 2014-04-15 DIAGNOSIS — Z79899 Other long term (current) drug therapy: Secondary | ICD-10-CM | POA: Insufficient documentation

## 2014-04-15 DIAGNOSIS — Z8719 Personal history of other diseases of the digestive system: Secondary | ICD-10-CM | POA: Insufficient documentation

## 2014-04-15 DIAGNOSIS — Z862 Personal history of diseases of the blood and blood-forming organs and certain disorders involving the immune mechanism: Secondary | ICD-10-CM | POA: Insufficient documentation

## 2014-04-15 DIAGNOSIS — Z7982 Long term (current) use of aspirin: Secondary | ICD-10-CM | POA: Insufficient documentation

## 2014-04-15 DIAGNOSIS — I498 Other specified cardiac arrhythmias: Secondary | ICD-10-CM | POA: Insufficient documentation

## 2014-04-15 DIAGNOSIS — I471 Supraventricular tachycardia: Secondary | ICD-10-CM

## 2014-04-15 LAB — CBC
HCT: 43.8 % (ref 36.0–46.0)
Hemoglobin: 14.8 g/dL (ref 12.0–15.0)
MCH: 31.8 pg (ref 26.0–34.0)
MCHC: 33.8 g/dL (ref 30.0–36.0)
MCV: 94 fL (ref 78.0–100.0)
PLATELETS: 308 10*3/uL (ref 150–400)
RBC: 4.66 MIL/uL (ref 3.87–5.11)
RDW: 13.1 % (ref 11.5–15.5)
WBC: 20.4 10*3/uL — AB (ref 4.0–10.5)

## 2014-04-15 LAB — BASIC METABOLIC PANEL
BUN: 20 mg/dL (ref 6–23)
CHLORIDE: 105 meq/L (ref 96–112)
CO2: 21 meq/L (ref 19–32)
Calcium: 9.7 mg/dL (ref 8.4–10.5)
Creatinine, Ser: 0.72 mg/dL (ref 0.50–1.10)
GFR calc Af Amer: >90 mL/min (ref >90)
GFR calc non Af Amer: 88 mL/min — ABNORMAL LOW (ref >90)
Glucose, Bld: 167 mg/dL — ABNORMAL HIGH (ref 70–99)
Potassium: 4 mEq/L (ref 3.7–5.3)
Sodium: 140 mEq/L (ref 137–147)

## 2014-04-15 MED ORDER — ADENOSINE 6 MG/2ML IV SOLN
6.0000 mg | Freq: Once | INTRAVENOUS | Status: AC
  Administered 2014-04-15: 6 mg via INTRAVENOUS
  Filled 2014-04-15: qty 2

## 2014-04-15 MED ORDER — SODIUM CHLORIDE 0.9 % IV BOLUS (SEPSIS)
1000.0000 mL | Freq: Once | INTRAVENOUS | Status: AC
  Administered 2014-04-15: 1000 mL via INTRAVENOUS

## 2014-04-15 NOTE — ED Provider Notes (Signed)
CSN: [id number]     Arrival date & time 04/15/14  1903 History   First MD Initiated Contact with Patient 04/15/14 1927     Chief Complaint  Patient presents with  . Tachycardia     (Consider location/radiation/quality/duration/timing/severity/associated sxs/prior Treatment) Patient is a 68 y.o. female presenting with general illness. The history is provided by the patient.  Illness Severity:  Severe Onset quality:  Sudden Timing:  Constant Progression:  Unchanged Chronicity:  Recurrent Associated symptoms: fatigue   Associated symptoms: no abdominal pain, no chest pain, no congestion, no cough, no fever, no headaches, no rhinorrhea and no vomiting     68 yo F pw palpitations. Onset about 4 hours PTA after watching movie. H/o SVT. On nadalol 20mg  daily. Reports compliance. Attempted bearing down and carotid message without help. Took extra nadolol 20mg  (as directed to do in these circumstances by cardiology) without relief. Presenting to ED for further mgmt.  Endorses some LH sensation, fatigue, palpitations. Mild chest discomfort, but no chest pain. No sob. Previously feeling well. No recent illness.    Past Medical History  Diagnosis Date  . Hx of blood clots   . Thrombosis of arm   . Supraventricular tachycardia   . GERD (gastroesophageal reflux disease)   . Bowel disease   . IBS (irritable bowel syndrome)     remote history  . Complication of anesthesia     once with colonoscopy  . PONV (postoperative nausea and vomiting)   . Headache(784.0)   . Allergy   . Clotting disorder    Past Surgical History  Procedure Laterality Date  . Cystectomy  1977    Left Breast   . Cyst left breast  1976  . Supraventricular tachycardia ablation  09/29/2012    unsuccessful   Family History  Problem Relation Age of Onset  . Colon cancer Mother     Late 55's to early 32's  . Colon polyps Mother   . Diabetes Mother   . Heart disease Mother   . Other Mother     Irritable Bowel  Syndrome  . Diabetes Father   . Heart disease Father    History  Substance Use Topics  . Smoking status: Never Smoker   . Smokeless tobacco: Never Used  . Alcohol Use: Yes     Comment: occasional 1 glass of wine 1-2 tims a month   OB History   Grav Para Term Preterm Abortions TAB SAB Ect Mult Living                 Review of Systems  Constitutional: Positive for fatigue. Negative for fever and chills.  HENT: Negative for congestion and rhinorrhea.   Eyes: Negative for visual disturbance.  Respiratory: Negative for cough.   Cardiovascular: Negative for chest pain and leg swelling.  Gastrointestinal: Negative for vomiting and abdominal pain.  Genitourinary: Negative for flank pain and difficulty urinating.  Neurological: Positive for light-headedness. Negative for headaches.  All other systems reviewed and are negative.     Allergies  Codeine; Estrogens; and Sulfonamide derivatives  Home Medications   Prior to Admission medications   Medication Sig Start Date End Date Taking? Authorizing Provider  aspirin 81 MG tablet Take 81 mg by mouth daily.    Historical Provider, MD  Coenzyme Q10 (CO Q-10) 100 MG CAPS Take by mouth daily.    Historical Provider, MD  lovastatin (MEVACOR) 20 MG tablet Take 20 mg by mouth at bedtime.    Historical Provider, MD  Multiple Vitamin (MULTIVITAMIN) tablet Take 1 tablet by mouth daily.    Historical Provider, MD  nadolol (CORGARD) 20 MG tablet TAKE 1 TABLET (20 MG TOTAL) BY MOUTH DAILY.    Evans Lance, MD  Vitamin D, Ergocalciferol, (DRISDOL) 50000 UNITS CAPS Take 50,000 Units by mouth as directed. Every 2 weeks of the month on the 1st and the 15th of every month    Historical Provider, MD   BP 103/70  Pulse 151  Temp(Src) 98.1 F (36.7 C)  Resp 20  Wt 189 lb (85.73 kg)  SpO2 96% Physical Exam  Nursing note and vitals reviewed. Constitutional: She is oriented to person, place, and time. She appears well-developed and well-nourished.  No distress.  HENT:  Head: Normocephalic and atraumatic.  Eyes: Conjunctivae are normal. Right eye exhibits no discharge. Left eye exhibits no discharge.  Cardiovascular: Regular rhythm, normal heart sounds and intact distal pulses.   Tachycardic. 150's.  Pulmonary/Chest: Effort normal and breath sounds normal. No stridor. No respiratory distress. She has no wheezes. She has no rales.  Abdominal: Soft. There is no tenderness.  Musculoskeletal: She exhibits no edema and no tenderness.  Neurological: She is alert and oriented to person, place, and time.  Skin: Skin is warm and dry.  Psychiatric: She has a normal mood and affect. Her behavior is normal.    ED Course  Procedures (including critical care time) Labs Review Labs Reviewed  CBC - Abnormal; Notable for the following:    WBC 20.4 (*)    All other components within normal limits  BASIC METABOLIC PANEL - Abnormal; Notable for the following:    Glucose, Bld 167 (*)    GFR calc non Af Amer 88 (*)    All other components within normal limits    Imaging Review No results found.   EKG Interpretation None      MDM   Final diagnoses:  SVT (supraventricular tachycardia)    Tachycardic. H/o SVT. Rhythm strip cw SVT. Vagal maneuvers unsuccessful. Given adenosine 6mg  with successful conversion.  EKG without new ischemic changes. Do not suspect ACS. Screening labs reassuring except for leukocytosis. Doubt sepsis. No fevers. Felt fine immediately prior to this. Likely stress response. Discussed with patient. F/u pcp for recheck.   Patient observed in ED. No recurrence. Remains asymptomatic and HDS.   Patient discharged home. Return precautions given. To follow up with PCP. patient in agreement with plan.  Labs and imaging reviewed by myself and considered in medical decision making if ordered. Imaging interpreted by radiology.   Discussed case with Dr. Tomi Bamberger who is in agreement with assessment and plan.     Bonnita Hollow, MD 04/16/14 [id number]

## 2014-04-15 NOTE — ED Notes (Signed)
Here from MD office, HR 151, c/o dizziness and sweats, and throbbing jugulars.  denies pain. Alert, NAD, calm, interactive, no dyspnea noted.

## 2014-04-15 NOTE — Discharge Instructions (Signed)
Supraventricular Tachycardia °Supraventricular tachycardia (SVT) is an abnormal heart rhythm (arrhythmia) that causes the heart to beat very fast (tachycardia). This kind of fast heartbeat originates in the upper chambers of the heart (atria). SVT can cause the heart to beat greater than 100 beats per minute. SVT can have a rapid burst of heartbeats. This can start and stop suddenly without warning and is called nonsustained. SVT can also be sustained, in which the heart beats at a continuous fast rate.  °CAUSES  °There can be different causes of SVT. Some of these include: °· Heart valve problems such as mitral valve prolapse. °· An enlarged heart (hypertrophic cardiomyopathy). °· Congenital heart problems. °· Heart inflammation (pericarditis). °· Hyperthyroidism. °· Low potassium or magnesium levels. °· Caffeine. °· Drug use such as cocaine, methamphetamines, or stimulants. °· Some over-the-counter medicines such as: °· Decongestants. °· Diet medicines. °· Herbal medicines. °SYMPTOMS  °Symptoms of SVT can vary. Symptoms depend on whether the SVT is sustained or nonsustained. You may experience: °· No symptoms (asymptomatic). °· An awareness of your heart beating rapidly (palpitations). °· Shortness of breath. °· Chest pain or pressure. °If your blood pressure drops because of the SVT, you may experience: °· Fainting or near fainting. °· Weakness. °· Dizziness. °DIAGNOSIS  °Different tests can be performed to diagnose SVT, such as: °· An electrocardiogram (EKG). This is a painless test that records the electrical activity of your heart. °· Holter monitor. This is a 24 hour recording of your heart rhythm. You will be given a diary. Write down all symptoms that you have and what you were doing at the time you experienced symptoms. °· Arrhythmia monitor. This is a small device that your wear for several weeks. It records the heart rhythm when you have symptoms. °· Echocardiogram. This is an imaging test to help detect  abnormal heart structure such as congenital abnormalities, heart valve problems, or heart enlargement. °· Stress test. This test can help determine if the SVT is related to exercise. °· Electrophysiology study (EPS). This is a procedure that evaluates your heart's electrical system and can help your caregiver find the cause of your SVT. °TREATMENT  °Treatment of SVT depends on the symptoms, how often it recurs, and whether there are any underlying heart problems.  °· If symptoms are rare and no other cardiac disease is present, no treatment may be needed. °· Blood work may be done to check potassium, magnesium, and thyroid hormone levels to see if they are abnormal. If these levels are abnormal, treatment to correct the problems will occur. °Medicines °Your caregiver may use oral medicines to treat SVT. These medicines are given for long-term control of SVT. Medicines may be used alone or in combination with other treatments. These medicines work to slow nerve impulses in the heart muscle. These medicines can also be used to treat high blood pressure. Some of these medicines may include: °· Calcium channel blockers. °· Beta blockers. °· Digoxin. °Nonsurgical procedures °Nonsurgical techniques may be used if oral medicines do not work. Some examples include: °· Cardioversion. This technique uses either drugs or an electrical shock to restore a normal heart rhythm. °· Cardioversion drugs may be given through an intravenous (IV) line to help "reset" the heart rhythm. °· In electrical cardioversion, the caregiver shocks your heart to stop its beat for a split second. This helps to reset the heart to a normal rhythm. °· Ablation. This procedure is done under mild sedation. High frequency radio wave energy is used to   destroy the area of heart tissue responsible for the SVT. °HOME CARE INSTRUCTIONS  °· Do not smoke. °· Only take medicines prescribed by your caregiver. Check with your caregiver before using over-the-counter  medicines. °· Check with your caregiver about how much alcohol and caffeine (coffee, tea, colas, or chocolate) you may have. °· It is very important to keep all follow-up referrals and appointments in order to properly manage this problem. °SEEK IMMEDIATE MEDICAL CARE IF: °· You have dizziness. °· You faint or nearly faint. °· You have shortness of breath. °· You have chest pain or pressure. °· You have sudden nausea or vomiting. °· You have profuse sweating. °· You are concerned about how long your symptoms last. °· You are concerned about the frequency of your SVT episodes. °If you have the above symptoms, call your local emergency services (911 in [place] immediately. Do not drive yourself to the hospital. °MAKE SURE YOU:  °· Understand these instructions. °· Will watch your condition. °· Will get help right away if you are not doing well or get worse. °Document Released: 10/27/2005 Document Revised: 01/19/2012 Document Reviewed: 02/08/2009 °[employer]® Patient Information ©2014 [employer]. ° °

## 2014-04-16 NOTE — ED Provider Notes (Signed)
I saw and evaluated the patient, reviewed the resident's note and I agree with the findings and plan.   EKG Interpretation   Date/Time:  Saturday April 15 2014 19:09:12 EDT Ventricular Rate:  151 PR Interval:    QRS Duration: 116 QT Interval:  274 QTC Calculation: 434 R Axis:   24 Text Interpretation:  Supraventricular tachycardia ST \\T \ T wave  abnormality, consider inferior ischemia Abnormal ECG svt new since last  tracing Confirmed by Ruthann Angulo  MD-J, Attikus Bartoszek ([id number]) on 04/16/2014 3:39:43 PM      Pt has history of recurrent SVT.   In the ED given 6 mg adenosine with conversion back to Normal sinus rhythm.   Pt tolerated procedure well. Follow up with her cardiologist.  Dorie Rank, MD 04/16/14 [phone]

## 2014-04-20 ENCOUNTER — Telehealth: Payer: Self-pay | Admitting: Internal Medicine

## 2014-04-20 NOTE — Telephone Encounter (Signed)
Keep the medications the same and call if any further episodes of SVT   Had an episode in April and then again June.  She is going to continue her meds as directed and follow up with PCP for elevated WBC

## 2014-04-20 NOTE — Telephone Encounter (Signed)
New Message:  Pt is asking if any changes need to be made to her medications. Also, pt is asking if she needs to have a nother White blood count test drawn... She is requesting a call back from the nurse.. She states she was seen in the hospital last week.

## 2014-07-25 ENCOUNTER — Encounter: Payer: Self-pay | Admitting: Internal Medicine

## 2014-07-25 ENCOUNTER — Encounter: Payer: Self-pay | Admitting: Gastroenterology

## 2014-10-19 ENCOUNTER — Encounter (HOSPITAL_COMMUNITY): Payer: Self-pay | Admitting: Internal Medicine

## 2015-05-07 ENCOUNTER — Other Ambulatory Visit: Payer: Self-pay

## 2015-05-20 ENCOUNTER — Emergency Department (HOSPITAL_COMMUNITY): Payer: Medicare Other

## 2015-05-20 ENCOUNTER — Emergency Department (HOSPITAL_COMMUNITY)
Admission: EM | Admit: 2015-05-20 | Discharge: 2015-05-20 | Disposition: A | Discharge status: Home / Self Care | Payer: Medicare Other | Attending: Emergency Medicine | Admitting: Emergency Medicine

## 2015-05-20 DIAGNOSIS — Z8719 Personal history of other diseases of the digestive system: Secondary | ICD-10-CM | POA: Insufficient documentation

## 2015-05-20 DIAGNOSIS — I471 Supraventricular tachycardia: Secondary | ICD-10-CM | POA: Diagnosis not present

## 2015-05-20 DIAGNOSIS — R000 Tachycardia, unspecified: Secondary | ICD-10-CM | POA: Diagnosis present

## 2015-05-20 DIAGNOSIS — Z7982 Long term (current) use of aspirin: Secondary | ICD-10-CM | POA: Insufficient documentation

## 2015-05-20 DIAGNOSIS — Z79899 Other long term (current) drug therapy: Secondary | ICD-10-CM | POA: Insufficient documentation

## 2015-05-20 DIAGNOSIS — Z862 Personal history of diseases of the blood and blood-forming organs and certain disorders involving the immune mechanism: Secondary | ICD-10-CM | POA: Insufficient documentation

## 2015-05-20 NOTE — ED Notes (Signed)
ptwith hx of SVT comes in today with same, hr in traige 148, sts she was feeling flushed, and could tell she was in it.

## 2015-05-20 NOTE — ED Provider Notes (Signed)
CSN: [id number]     Arrival date & time 05/20/15  2152 History   First MD Initiated Contact with Patient 05/20/15 2200     Chief Complaint  Patient presents with  . Tachycardia     (Consider location/radiation/quality/duration/timing/severity/associated sxs/prior Treatment) HPI Comments: Patient presents to the ER for evaluation of rapid heartbeat. Patient reports that she had sudden onset of racing heartbeat, diaphoresis, diffuse facial flushing just prior to arrival. Patient reports that she has had these symptoms when she has had SVT in the past.   Past Medical History  Diagnosis Date  . Hx of blood clots   . Thrombosis of arm   . Supraventricular tachycardia   . GERD (gastroesophageal reflux disease)   . Bowel disease   . IBS (irritable bowel syndrome)     remote history  . Complication of anesthesia     once with colonoscopy  . PONV (postoperative nausea and vomiting)   . Headache(784.0)   . Allergy   . Clotting disorder    Past Surgical History  Procedure Laterality Date  . Cystectomy  1977    Left Breast   . Cyst left breast  1976  . Supraventricular tachycardia ablation  09/29/2012    unsuccessful  . Supraventricular tachycardia ablation N/A 09/29/2012    Procedure: SUPRAVENTRICULAR TACHYCARDIA ABLATION;  Surgeon: Evans Lance, MD;  Location: [facility] CATH LAB;  Service: Cardiovascular;  Laterality: N/A;   Family History  Problem Relation Age of Onset  . Colon cancer Mother     Late 44's to early 92's  . Colon polyps Mother   . Diabetes Mother   . Heart disease Mother   . Other Mother     Irritable Bowel Syndrome  . Diabetes Father   . Heart disease Father    History  Substance Use Topics  . Smoking status: Never Smoker   . Smokeless tobacco: Never Used  . Alcohol Use: Yes     Comment: occasional 1 glass of wine 1-2 tims a month   OB History    No data available     Review of Systems  Constitutional: Positive for diaphoresis.  Cardiovascular:  Positive for palpitations.  All other systems reviewed and are negative.     Allergies  Codeine; Estrogens; and Sulfonamide derivatives  Home Medications   Prior to Admission medications   Medication Sig Start Date End Date Taking? Authorizing Provider  aspirin 81 MG tablet Take 81 mg by mouth daily.    Historical Provider, MD  Coenzyme Q10 (CO Q-10) 100 MG CAPS Take by mouth daily.    Historical Provider, MD  lovastatin (MEVACOR) 20 MG tablet Take 20 mg by mouth at bedtime.    Historical Provider, MD  Multiple Vitamin (MULTIVITAMIN) tablet Take 1 tablet by mouth daily.    Historical Provider, MD  nadolol (CORGARD) 20 MG tablet TAKE 1 TABLET (20 MG TOTAL) BY MOUTH DAILY.    Evans Lance, MD  Vitamin D, Ergocalciferol, (DRISDOL) 50000 UNITS CAPS Take 50,000 Units by mouth as directed. Every 2 weeks of the month on the 1st and the 15th of every month    Historical Provider, MD   BP 107/72 mmHg  Pulse 77  Temp(Src) 97.5 F (36.4 C) (Oral)  Resp 21  Ht 5\' 4"  (1.626 m)  Wt 185 lb (83.915 kg)  BMI 31.74 kg/m2  SpO2 95% Physical Exam  Constitutional: She is oriented to person, place, and time. She appears well-developed and well-nourished. No distress.  HENT:  Head: Normocephalic and atraumatic.  Right Ear: Hearing normal.  Left Ear: Hearing normal.  Nose: Nose normal.  Mouth/Throat: Oropharynx is clear and moist and mucous membranes are normal.  Eyes: Conjunctivae and EOM are normal. Pupils are equal, round, and reactive to light.  Neck: Normal range of motion. Neck supple.  Cardiovascular: Regular rhythm, S1 normal and S2 normal.  Exam reveals no gallop and no friction rub.   No murmur heard. Pulmonary/Chest: Effort normal and breath sounds normal. No respiratory distress. She exhibits no tenderness.  Abdominal: Soft. Normal appearance and bowel sounds are normal. There is no hepatosplenomegaly. There is no tenderness. There is no rebound, no guarding, no tenderness at  McBurney's point and negative Murphy's sign. No hernia.  Musculoskeletal: Normal range of motion.  Neurological: She is alert and oriented to person, place, and time. She has normal strength. No cranial nerve deficit or sensory deficit. Coordination normal. GCS eye subscore is 4. GCS verbal subscore is 5. GCS motor subscore is 6.  Skin: Skin is warm, dry and intact. No rash noted. No cyanosis.  Psychiatric: She has a normal mood and affect. Her speech is normal and behavior is normal. Thought content normal.  Nursing note and vitals reviewed.   ED Course  Procedures (including critical care time) Labs Review Labs Reviewed - No data to display  Imaging Review No results found.   EKG Interpretation   Date/Time:  Sunday May 20 2015 21:56:22 EDT Ventricular Rate:  146 PR Interval:    QRS Duration: 126 QT Interval:  310 QTC Calculation: 483 R Axis:   31 Text Interpretation:  Wide QRS tachycardia Non-specific intra-ventricular  conduction block Abnormal ECG Confirmed by POLLINA  MD, CHRISTOPHER  [phone]) on 05/20/2015 10:00:11 PM      EKG Interpretation  Date/Time:  Sunday May 20 2015 22:06:33 EDT Ventricular Rate:  77 PR Interval:  128 QRS Duration: 92 QT Interval:  382 QTC Calculation: 432 R Axis:   26 Text Interpretation:  Sinus rhythm Low voltage, precordial leads Nonspecific ST and T wave abnormality Baseline wander in lead(s) II Confirmed by POLLINA  MD, CHRISTOPHER ([id number]) on 05/20/2015 10:26:25 PM        MDM   Final diagnoses:  None  PSVT  Patient presents to the ER for evaluation of rapid heartbeat. She has a history of PSVT. She is on nadolol which she takes once daily, but admits that she has missed some doses. She did take a dose this evening and then waited an hours, took a second dose. After approximately 1-1/2 hours, heart was racing so she came to the ER. EKG performed in triage revealed SVT. Upon arrival into the ER evaluation room, however, she was found  to be in sinus rhythm. Patient was observed in the ER and has had no occurrence of arrhythmia. Will be discharge, follow-up with her cardiologist, Dr. Lovena Le.    Orpah Greek, MD 05/20/15 2228

## 2015-05-20 NOTE — Discharge Instructions (Signed)
Supraventricular Tachycardia °Supraventricular tachycardia (SVT) is an abnormal heart rhythm (arrhythmia) that causes the heart to beat very fast (tachycardia). This kind of fast heartbeat originates in the upper chambers of the heart (atria). SVT can cause the heart to beat greater than 100 beats per minute. SVT can have a rapid burst of heartbeats. This can start and stop suddenly without warning and is called nonsustained. SVT can also be sustained, in which the heart beats at a continuous fast rate.  °CAUSES  °There can be different causes of SVT. Some of these include: °· Heart valve problems such as mitral valve prolapse. °· An enlarged heart (hypertrophic cardiomyopathy). °· Congenital heart problems. °· Heart inflammation (pericarditis). °· Hyperthyroidism. °· Low potassium or magnesium levels. °· Caffeine. °· Drug use such as cocaine, methamphetamines, or stimulants. °· Some over-the-counter medicines such as: °¨ Decongestants. °¨ Diet medicines. °¨ Herbal medicines. °SYMPTOMS  °Symptoms of SVT can vary. Symptoms depend on whether the SVT is sustained or nonsustained. You may experience: °· No symptoms (asymptomatic). °· An awareness of your heart beating rapidly (palpitations). °· Shortness of breath. °· Chest pain or pressure. °If your blood pressure drops because of the SVT, you may experience: °· Fainting or near fainting. °· Weakness. °· Dizziness. °DIAGNOSIS  °Different tests can be performed to diagnose SVT, such as: °· An electrocardiogram (EKG). This is a painless test that records the electrical activity of your heart. °· Holter monitor. This is a 24 hour recording of your heart rhythm. You will be given a diary. Write down all symptoms that you have and what you were doing at the time you experienced symptoms. °· Arrhythmia monitor. This is a small device that your wear for several weeks. It records the heart rhythm when you have symptoms. °· Echocardiogram. This is an imaging test to help detect  abnormal heart structure such as congenital abnormalities, heart valve problems, or heart enlargement. °· Stress test. This test can help determine if the SVT is related to exercise. °· Electrophysiology study (EPS). This is a procedure that evaluates your heart's electrical system and can help your caregiver find the cause of your SVT. °TREATMENT  °Treatment of SVT depends on the symptoms, how often it recurs, and whether there are any underlying heart problems.  °· If symptoms are rare and no other cardiac disease is present, no treatment may be needed. °· Blood work may be done to check potassium, magnesium, and thyroid hormone levels to see if they are abnormal. If these levels are abnormal, treatment to correct the problems will occur. °Medicines °Your caregiver may use oral medicines to treat SVT. These medicines are given for long-term control of SVT. Medicines may be used alone or in combination with other treatments. These medicines work to slow nerve impulses in the heart muscle. These medicines can also be used to treat high blood pressure. Some of these medicines may include: °· Calcium channel blockers. °· Beta blockers. °· Digoxin. °Nonsurgical procedures °Nonsurgical techniques may be used if oral medicines do not work. Some examples include: °· Cardioversion. This technique uses either drugs or an electrical shock to restore a normal heart rhythm. °¨ Cardioversion drugs may be given through an intravenous (IV) line to help "reset" the heart rhythm. °¨ In electrical cardioversion, the caregiver shocks your heart to stop its beat for a split second. This helps to reset the heart to a normal rhythm. °· Ablation. This procedure is done under mild sedation. High frequency radio wave energy is used to   destroy the area of heart tissue responsible for the SVT. °HOME CARE INSTRUCTIONS  °· Do not smoke. °· Only take medicines prescribed by your caregiver. Check with your caregiver before using over-the-counter  medicines. °· Check with your caregiver about how much alcohol and caffeine (coffee, tea, colas, or chocolate) you may have. °· It is very important to keep all follow-up referrals and appointments in order to properly manage this problem. °SEEK IMMEDIATE MEDICAL CARE IF: °· You have dizziness. °· You faint or nearly faint. °· You have shortness of breath. °· You have chest pain or pressure. °· You have sudden nausea or vomiting. °· You have profuse sweating. °· You are concerned about how long your symptoms last. °· You are concerned about the frequency of your SVT episodes. °If you have the above symptoms, call your local emergency services (911 in [place] immediately. Do not drive yourself to the hospital. °MAKE SURE YOU:  °· Understand these instructions. °· Will watch your condition. °· Will get help right away if you are not doing well or get worse. °Document Released: 10/27/2005 Document Revised: 01/19/2012 Document Reviewed: 02/08/2009 °ExitCare® Patient Information ©2015 [employer]. This information is not intended to replace advice given to you by your health care provider. Make sure you discuss any questions you have with your health care provider. ° °

## 2015-05-21 ENCOUNTER — Telehealth: Payer: Self-pay | Admitting: Internal Medicine

## 2015-05-21 NOTE — Telephone Encounter (Signed)
Message received  Reviewing ED record for specific discharge instructions after episode of SVT  Is to follow up with Dr. Lovena Le.  Patient said that she is feeling fine this morning with HR at 59  Said this wasn't any longer or different from previous episodes  Took total of two doses of Corgard 20 mg each,  HR was 160 and 150.  After getting to the ED and back into the treatment area fast heart rate abated without further treatment.  Will route to Dr. Lovena Le with question:  When would you like for her to follow up for OV?

## 2015-05-21 NOTE — Telephone Encounter (Signed)
New message      Pt was seen in ER last night with SVT.  She was instructed to call out office this am for further instructions

## 2015-05-22 ENCOUNTER — Telehealth: Payer: Self-pay | Admitting: Internal Medicine

## 2015-05-22 NOTE — Telephone Encounter (Signed)
Per Claiborne Billings pt needs to come in to discuss SVT ablation-pt states she had this done around 2014 and it only lasted 6 mos so she doesn't think he wants to do a repeat, she did have an EKG she said and maybe that showed something, she doesn't wants to schedule until she talk to a nurse

## 2015-05-22 NOTE — Telephone Encounter (Signed)
Melissa is calling to schedule patient with Dr Lovena Le next available opening

## 2015-05-22 NOTE — Telephone Encounter (Signed)
I talked with [name], she said:  Sunday night she had a high heart rate 160 after taking  2 medications to break fast heart rate. Heart rate was not going down so she went to the ED for the "IV medication" to slow her down.  Heart rate spontaneously broke 4 hours after medication without other medication intervention while at the ED. They kept her an hour more for observation and then was DC.  Had unsuccessful  ablation 2014 and questions if it is necessary.  She recalls, Dr. Lovena Le saying that the arrhythmia was from a place very near the natural pace maker and did he not want to mess that up  Last episode was 6 months ago and she doesn't feel it occurs that often.  She wonders if possibly needs a change in her medication.  Would it be helpful for her to come in and have a consult with Dr. Lovena Le about having an ablation or not?

## 2015-05-23 NOTE — Telephone Encounter (Signed)
Spoke with patient and explained to her the reasoning behind being seen in follow up.  She was due in Jan and has now had an episode that sent her to the hospital.  She agreed with plan to see Lovena Le.  Please call on her cell to schedule appointment

## 2015-05-23 NOTE — Telephone Encounter (Signed)
Appointment scheduled.

## 2015-07-11 ENCOUNTER — Ambulatory Visit (INDEPENDENT_AMBULATORY_CARE_PROVIDER_SITE_OTHER): Payer: Medicare Other | Admitting: Internal Medicine

## 2015-07-11 ENCOUNTER — Encounter: Payer: Self-pay | Admitting: Internal Medicine

## 2015-07-11 VITALS — BP 110/74 | HR 61 | Ht 63.5 in | Wt 180.0 lb

## 2015-07-11 DIAGNOSIS — I471 Supraventricular tachycardia: Secondary | ICD-10-CM | POA: Diagnosis not present

## 2015-07-11 DIAGNOSIS — R0789 Other chest pain: Secondary | ICD-10-CM | POA: Diagnosis not present

## 2015-07-11 NOTE — Patient Instructions (Signed)
Medication Instructions: - no changes  Labwork: - none  Procedures/Testing: - none  Follow-Up: - Your physician wants you to follow-up in: 1 year with Dr. Lovena Le. You will receive a reminder letter in the mail two months in advance. If you don't receive a letter, please call our office to schedule the follow-up appointment.  Any Additional Special Instructions Will Be Listed Below (If Applicable).

## 2015-07-11 NOTE — Progress Notes (Signed)
HPI [name] returns today for followup. 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 She has been treated with Nadolol and done well.  She notes 2 episodes of SVT in the last 6 months. Both were controlled with medications. One episode did require emergency room evaluation. She has lost 5-10 lbs. She is exercising a couple of times a week. She is avoiding caffeine. Allergies  Allergen Reactions  . Codeine Nausea Only    REACTION: adverse reaction  . Estrogens     Clotting   . Sulfonamide Derivatives Rash and Other (See Comments)    Fever, fluid retention      Current Outpatient Prescriptions  Medication Sig Dispense Refill  . aspirin 81 MG tablet Take 81 mg by mouth daily.    . Calcium Carb-Cholecalciferol (CALCIUM 1000 + D PO) Take 2 tablets by mouth daily.    Marland Kitchen glucosamine-chondroitin 500-400 MG tablet Take 1 tablet by mouth 3 (three) times daily.    . Multiple Vitamin (MULTIVITAMIN) tablet Take 1 tablet by mouth daily.    . nadolol (CORGARD) 20 MG tablet TAKE 1 TABLET (20 MG TOTAL) BY MOUTH DAILY. 30 tablet 6  . Vitamin D, Ergocalciferol, (DRISDOL) 50000 UNITS CAPS Take 50,000 Units by mouth as directed. Every 2 weeks of the month on the 1st and the 15th of every month    . ZETIA 10 MG tablet Take 10 mg by mouth daily.     No current facility-administered medications for this visit.     Past Medical History  Diagnosis Date  . Hx of blood clots   . Thrombosis of arm   . Supraventricular tachycardia   . GERD (gastroesophageal reflux disease)   . Bowel disease   . IBS (irritable bowel syndrome)     remote history  . Complication of anesthesia     once with colonoscopy  . PONV (postoperative nausea and vomiting)   . Headache(784.0)   . Allergy   . Clotting disorder     ROS:   All systems reviewed and negative except as noted in the HPI.   Past Surgical History  Procedure Laterality  Date  . Cystectomy  1977    Left Breast   . Cyst left breast  1976  . Supraventricular tachycardia ablation  09/29/2012    unsuccessful  . Supraventricular tachycardia ablation N/A 09/29/2012    Procedure: SUPRAVENTRICULAR TACHYCARDIA ABLATION;  Surgeon: Evans Lance, MD;  Location[facility] C[facility] LAB;  Service: Cardiovascular;  Laterality: N/A;     Family History  Problem Relation Age of Onset  . Colon cancer Mother     Late 23's to early 13's  . Colon polyps Mother   . Diabetes Mother   . Heart disease Mother   . Other Mother     Irritable Bowel Syndrome  . Diabetes Father   . Heart disease Father      Social History   Social History  . Marital Status: Married    Spouse Name: N/A  . Number of Children: N/A  . Years of Education: N/A   Occupational History  . Not on file.   Social History Main Topics  . Smoking status: Never Smoker   . Smokeless tobacco: Never Used  . Alcohol Use: Yes     Comment: occasional 1 glass of wine 1-2 tims a month  . Drug Use: No  . Sexual Activity: Yes  Birth Control/ Protection: None   Other Topics Concern  . Not on file   Social History Narrative     BP 110/74 mmHg  Pulse 61  Ht 5' 3.5" (1.613 m)  Wt 180 lb (81.647 kg)  BMI 31.38 kg/m2  Physical Exam:  Well appearing 69 year old woman,NAD HEENT: Unremarkable Neck:  6 cm JVD, no thyromegally Back:  No CVA tenderness Lungs:  Clear with no wheezes, rales, or rhonchi. HEART:  Regular rate rhythm, no murmurs, no rubs, no clicks Abd:  soft, positive bowel sounds, no organomegally, no rebound, no guarding Ext:  2 plus pulses, no edema, no cyanosis, no clubbing Skin:  No rashes no nodules Neuro:  CN II through XII intact, motor grossly intact  EKG - sinus bradycardia with LVH    Assess/Plan:

## 2015-07-11 NOTE — Assessment & Plan Note (Signed)
We again discussed treatment options. She would like to undergo watchful waiting. I will see her back in a year. She will continue Nadolol.

## 2015-07-11 NOTE — Assessment & Plan Note (Signed)
She has had no recurrent symptoms. Will follow. She is exercising regularly.

## 2015-09-25 ENCOUNTER — Other Ambulatory Visit: Payer: Self-pay | Admitting: Family Medicine

## 2015-09-25 DIAGNOSIS — L989 Disorder of the skin and subcutaneous tissue, unspecified: Secondary | ICD-10-CM

## 2015-10-01 ENCOUNTER — Ambulatory Visit
Admission: RE | Admit: 2015-10-01 | Discharge: 2015-10-01 | Disposition: A | Discharge status: Home / Self Care | Payer: Medicare Other | Source: Ambulatory Visit | Attending: Family Medicine | Admitting: Family Medicine

## 2015-10-01 ENCOUNTER — Other Ambulatory Visit: Payer: Self-pay | Admitting: Family Medicine

## 2015-10-01 DIAGNOSIS — L989 Disorder of the skin and subcutaneous tissue, unspecified: Secondary | ICD-10-CM

## 2015-10-18 ENCOUNTER — Telehealth: Payer: Self-pay | Admitting: Internal Medicine

## 2015-10-18 NOTE — Telephone Encounter (Signed)
New problem   Pt has an episode of SVT this morning. Wanted to let nurse know.

## 2015-10-18 NOTE — Telephone Encounter (Signed)
Spoke with patient and she had an episode of SVT this morning HR 160 and felt like she was going to pass out.  She had taken the Nadolol 45 min prior.  The episode lasted about 25min.  Went away on it's own.  She just wanted [place] to be aware.  This was more of an FYI call

## 2016-02-05 ENCOUNTER — Telehealth: Payer: Self-pay | Admitting: Internal Medicine

## 2016-02-05 NOTE — Telephone Encounter (Signed)
LMTCB

## 2016-02-05 NOTE — Telephone Encounter (Signed)
LMTCB-cc 

## 2016-02-05 NOTE — Telephone Encounter (Signed)
Pt called to let us know she had a pvc episode yesterday, feels fine today, just was told to let us know when it happens

## 2016-02-05 NOTE — Telephone Encounter (Signed)
Took Nadolol 20 mg yesterday am,went for walk in park,felt sweaty,dizzy,HR 180-190.Sat down,sx's lasted 45 minutes. Later that night at 8 pm,SVT returned for 1 hr,she took additional Nadolol 20 mg.After 45 minutes,HR was back in 60's. Patient feels tired today which she relates to two episodes of SVT yesterday.

## 2016-02-06 NOTE — Telephone Encounter (Signed)
Would recommend repeat ablation. She may followup with me to discuss this.

## 2016-02-07 NOTE — Telephone Encounter (Signed)
Pt notified will call office to see Dr Lovena Le

## 2016-02-13 ENCOUNTER — Ambulatory Visit (INDEPENDENT_AMBULATORY_CARE_PROVIDER_SITE_OTHER): Payer: Medicare Other | Admitting: Internal Medicine

## 2016-02-13 ENCOUNTER — Encounter: Payer: Self-pay | Admitting: Internal Medicine

## 2016-02-13 VITALS — BP 124/62 | HR 63 | Ht 64.0 in | Wt 192.2 lb

## 2016-02-13 DIAGNOSIS — I471 Supraventricular tachycardia: Secondary | ICD-10-CM

## 2016-02-13 MED ORDER — FLECAINIDE ACETATE 50 MG PO TABS: 50.0000 mg | ORAL_TABLET | Freq: Two times a day (BID) | ORAL | Status: DC

## 2016-02-13 MED ORDER — NADOLOL 20 MG PO TABS: 10.0000 mg | ORAL_TABLET | Freq: Every day | ORAL | Status: DC

## 2016-02-13 NOTE — Progress Notes (Signed)
HPI [name] returns today for followup. 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 She has been treated with Nadolol and initially did well.  However, she has had recurrent episodes of SVT requiring ER visits. She has had some fatigue as well with her beta blocker.  Allergies  Allergen Reactions  . Codeine Nausea Only    REACTION: adverse reaction  . Estrogens Other (See Comments)    Clotting   . Sulfonamide Derivatives Rash and Other (See Comments)    Fever, fluid retention      Current Outpatient Prescriptions  Medication Sig Dispense Refill  . aspirin 81 MG tablet Take 81 mg by mouth daily.    . Calcium Carb-Cholecalciferol (CALCIUM 1000 + D PO) Take 2 tablets by mouth daily.    Marland Kitchen glucosamine-chondroitin 500-400 MG tablet Take 1 tablet by mouth 3 (three) times daily.    . Multiple Vitamin (MULTIVITAMIN) tablet Take 1 tablet by mouth daily.    . nadolol (CORGARD) 20 MG tablet TAKE 1 TABLET (20 MG TOTAL) BY MOUTH DAILY. 30 tablet 6  . Vitamin D, Ergocalciferol, (DRISDOL) 50000 UNITS CAPS Take 50,000 Units by mouth as directed. Every 2 weeks of the month on the 1st and the 15th of every month    . ZETIA 10 MG tablet Take 10 mg by mouth daily.     No current facility-administered medications for this visit.     Past Medical History  Diagnosis Date  . Hx of blood clots   . Thrombosis of arm   . Supraventricular tachycardia ([place])   . GERD (gastroesophageal reflux disease)   . Bowel disease   . IBS (irritable bowel syndrome)     remote history  . Complication of anesthesia     once with colonoscopy  . PONV (postoperative nausea and vomiting)   . Headache(784.0)   . Allergy   . Clotting disorder ([place])     ROS:   All systems reviewed and negative except as noted in the HPI.   Past Surgical History  Procedure Laterality Date  . Cystectomy  1977    Left Breast   . Cyst left breast  1976   . Supraventricular tachycardia ablation  09/29/2012    unsuccessful  . Supraventricular tachycardia ablation N/A 09/29/2012    Procedure: SUPRAVENTRICULAR TACHYCARDIA ABLATION;  Surgeon: Evans Lance, MD;  Location: [facility] CATH LAB;  Service: Cardiovascular;  Laterality: N/A;     Family History  Problem Relation Age of Onset  . Colon cancer Mother     Late 21's to early 44's  . Colon polyps Mother   . Diabetes Mother   . Heart disease Mother   . Other Mother     Irritable Bowel Syndrome  . Diabetes Father   . Heart disease Father      Social History   Social History  . Marital Status: Married    Spouse Name: N/A  . Number of Children: N/A  . Years of Education: N/A   Occupational History  . Not on file.   Social History Main Topics  . Smoking status: Never Smoker   . Smokeless tobacco: Never Used  . Alcohol Use: Yes     Comment: occasional 1 glass of wine 1-2 tims a month  . Drug Use: No  . Sexual Activity: Yes    Birth Control/ Protection: None   Other Topics Concern  . Not  on file   Social History Narrative     BP 124/62 mmHg  Pulse 63  Ht 5\' 4"  (1.626 m)  Wt 192 lb 3.2 oz (87.181 kg)  BMI 32.97 kg/m2  SpO2 98%  Physical Exam:  Well appearing 70 year old woman,NAD HEENT: Unremarkable Neck:  5 cm JVD, no thyromegally Back:  No CVA tenderness Lungs:  Clear with no wheezes, rales, or rhonchi. HEART:  Regular rate rhythm, no murmurs, no rubs, no clicks Abd:  soft, positive bowel sounds, no organomegally, no rebound, no guarding Ext:  2 plus pulses, no edema, no cyanosis, no clubbing Skin:  No rashes no nodules Neuro:  CN II through XII intact, motor grossly intact    Assess/Plan: 1. SVT - I have discussed the treatment options in detail. She does not want to undergo another ablation. She feels fatigued on Propranolol. She is willing to start flecainide. We will schedule. She will call us and let us know if the flecainide is working. She will return  for a 12 lead ecg about a month after starting flecainide and when she gets to a therapeutic dose. Treadmill testing will be carried out as well. 2. HTN -  Her blood pressure is well controlled. Will follow.  3. Dyslipidemia -she will continue her Zetia.  Mikle Bosworth.D.

## 2016-02-13 NOTE — Patient Instructions (Addendum)
Medication Instructions:  Your physician has recommended you make the following change in your medication:  1) Decrease Propanolol to 10 mg daily 2) Start Flecainide 50 mg twice daily Call in 2 weeks and let me know how the medication is working and how you are feeling   Labwork: None ordered   Testing/Procedures: None ordered   Follow-Up: Call in 2 weeks and let me know how the medication is working and how you are feeling  We will either keep the medication the same and order a treadmill or increase the Flecainide and get a treadmill  Any Other Special Instructions Will Be Listed Below (If Applicable).     If you need a refill on your cardiac medications before your next appointment, please call your pharmacy.

## 2016-03-03 ENCOUNTER — Other Ambulatory Visit: Payer: Self-pay | Admitting: Family Medicine

## 2016-03-03 DIAGNOSIS — Z1231 Encounter for screening mammogram for malignant neoplasm of breast: Secondary | ICD-10-CM

## 2016-03-03 DIAGNOSIS — M8580 Other specified disorders of bone density and structure, unspecified site: Secondary | ICD-10-CM

## 2016-03-04 ENCOUNTER — Telehealth: Payer: Self-pay | Admitting: Internal Medicine

## 2016-03-04 NOTE — Telephone Encounter (Signed)
Spoke with patient, who states she is adjusting to flecainide She wanted to know if she is having fast HR, if she can take extra flecainide, as she used to take an extra nadolol Explained that this was not specified on the Rx or the MD note so I would have to defer to Dr. Lovena Le to advise Patient verbalized understanding.   Message routed to Dr. Jacolyn Reedy, RN

## 2016-03-04 NOTE — Telephone Encounter (Signed)
New Message  Pt calling to speak w/ RN concernin gher rx of flecanide. Please call back and discuss.

## 2016-03-07 NOTE — Telephone Encounter (Signed)
F/u  Pt following up on her rxn to flecanide- stated she is feeling "faint" after taking. Please call back and discuss.

## 2016-03-07 NOTE — Telephone Encounter (Signed)
Returned call to patient and she is now complaining of feeling dizzy every morning for several hours after taking her Flecainide and her BP and HR are both up 150/60's HR 80's.  She does not feel this way in the afternoons.  She can't figure out why this is going on.  I told her I felt like if it was related to the medication it would not just be in the morning that she would be experiencing the symptoms.  She is aware Dr Lovena Le will be back on Tuesday to discuss and I will call her back

## 2016-03-11 NOTE — Telephone Encounter (Signed)
Discussed with Dr Lovena Le, she can take an extra dose of Flecainide 50 mg if needed.  Spoke with patient and she has been feeling better since Sat.  The dizziness could have been from vaccines she received due to going on mission trip.  She will continue to keep herself hydrated and call back if she feels she needs to take the additional dose often as we will need to change her prescription.

## 2016-03-14 ENCOUNTER — Encounter: Payer: Medicare Other | Admitting: Internal Medicine

## 2016-03-24 ENCOUNTER — Ambulatory Visit
Admission: RE | Admit: 2016-03-24 | Discharge: 2016-03-24 | Disposition: A | Discharge status: Home / Self Care | Payer: Medicare Other | Source: Ambulatory Visit | Attending: Family Medicine | Admitting: Family Medicine

## 2016-03-24 DIAGNOSIS — Z1231 Encounter for screening mammogram for malignant neoplasm of breast: Secondary | ICD-10-CM

## 2016-03-24 DIAGNOSIS — M8580 Other specified disorders of bone density and structure, unspecified site: Secondary | ICD-10-CM

## 2016-05-20 ENCOUNTER — Telehealth: Payer: Self-pay | Admitting: Internal Medicine

## 2016-05-20 NOTE — Telephone Encounter (Signed)
Spoke with patient and let her know I would discuss with Dr Lovena Le on Thurs what he wants to do and get back with her.  She is feeling better and only has occasional brief dizzy spells.

## 2016-05-20 NOTE — Telephone Encounter (Signed)
New message     The pt states she was stay on the medication then after about 2 weeks of being on the medication we were suppose to schedule a stress test   Pt c/o medication issue:  1. Name of Medication: flecainide  2. How are you currently taking this medication (dosage and times per day)? 50 mg take it twice daily  3. Are you having a reaction (difficulty breathing--STAT)? no  4. What is your medication issue? The pt is calling back to find why we did not call about a stress test

## 2016-05-23 NOTE — Telephone Encounter (Signed)
Discussed with Dr Lovena Le and she can continue the Flecainide 50 mg bid and obtain an EKG next week and follow up with Dr Lovena Le in Sept.  [name] is going to call her to schedule the appointments.

## 2016-05-29 ENCOUNTER — Ambulatory Visit (INDEPENDENT_AMBULATORY_CARE_PROVIDER_SITE_OTHER): Payer: Medicare Other | Admitting: *Deleted

## 2016-05-29 DIAGNOSIS — I471 Supraventricular tachycardia: Secondary | ICD-10-CM

## 2016-05-29 NOTE — Progress Notes (Signed)
Patient here today for an EKG.  She is currently taking Flecainide 50 mg bid.  She reports not more fast heart rates.  EKG shown to Dr Tamala Julian, DOD, and patient made her follow up appointment with Dr Lovena Le for 07/21/16 at [facility]

## 2016-05-29 NOTE — Patient Instructions (Signed)
Medication Instructions:  Your physician recommends that you continue on your current medications as directed. Please refer to the Current Medication list given to you today.   Labwork: None ordered   Testing/Procedures: None ordered   Follow-Up: Your physician recommends that you schedule a follow-up appointment as scheduled   Any Other Special Instructions Will Be Listed Below (If Applicable).     If you need a refill on your cardiac medications before your next appointment, please call your pharmacy.    

## 2016-07-04 ENCOUNTER — Other Ambulatory Visit: Payer: Self-pay | Admitting: Internal Medicine

## 2016-07-07 ENCOUNTER — Encounter: Payer: Self-pay | Admitting: Internal Medicine

## 2016-07-21 ENCOUNTER — Encounter: Payer: Self-pay | Admitting: Internal Medicine

## 2016-07-21 ENCOUNTER — Ambulatory Visit (INDEPENDENT_AMBULATORY_CARE_PROVIDER_SITE_OTHER): Payer: Medicare Other | Admitting: Internal Medicine

## 2016-07-21 VITALS — BP 132/80 | HR 63 | Ht 64.0 in | Wt 191.0 lb

## 2016-07-21 DIAGNOSIS — I10 Essential (primary) hypertension: Secondary | ICD-10-CM

## 2016-07-21 DIAGNOSIS — I471 Supraventricular tachycardia: Secondary | ICD-10-CM

## 2016-07-21 DIAGNOSIS — R55 Syncope and collapse: Secondary | ICD-10-CM

## 2016-07-21 NOTE — Patient Instructions (Signed)
Medication Instructions:  Your physician recommends that you continue on your current medications as directed. Please refer to the Current Medication list given to you today.   Labwork: None Ordered   Testing/Procedures: Your physician has requested that you have an exercise tolerance test. For further information please visit [email or web address]. Please also follow instruction sheet, as given.   Follow-Up: Follow-up pending results of stress test.   Any Other Special Instructions Will Be Listed Below (If Applicable).     If you need a refill on your cardiac medications before your next appointment, please call your pharmacy.

## 2016-07-21 NOTE — Progress Notes (Signed)
HPI [name] returns today for followup. 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 She has been treated with Nadolol and initially did well.  However, she has had recurrent episodes of SVT requiring ER visits. She has had some fatigue as well with her beta blocker. We started Flecainide and she has had no additional arrhythmias. She notes that when she walks she gets lightheaded and feels like she might pass out. No palpitations. Also some sob. Allergies  Allergen Reactions  . Codeine Nausea Only    REACTION: adverse reaction  . Estrogens Other (See Comments)    Clotting   . Sulfonamide Derivatives Rash and Other (See Comments)    Fever, fluid retention      Current Outpatient Prescriptions  Medication Sig Dispense Refill  . aspirin 81 MG tablet Take 81 mg by mouth daily.    . Calcium Carb-Cholecalciferol (CALCIUM 1000 + D PO) Take 2 tablets by mouth daily.    . flecainide (TAMBOCOR) 50 MG tablet TAKE 1 TABLET BY MOUTH TWICE DAILY 60 tablet 8  . glucosamine-chondroitin 500-400 MG tablet Take 1 tablet by mouth 3 (three) times daily.    . Multiple Vitamin (MULTIVITAMIN) tablet Take 1 tablet by mouth daily.    . nadolol (CORGARD) 20 MG tablet Take 0.5 tablets (10 mg total) by mouth daily. 30 tablet 6  . Vitamin D, Ergocalciferol, (DRISDOL) 50000 UNITS CAPS Take 50,000 Units by mouth as directed. Every 2 weeks of the month on the 1st and the 15th of every month    . ZETIA 10 MG tablet Take 10 mg by mouth daily.     No current facility-administered medications for this visit.      Past Medical History:  Diagnosis Date  . Allergy   . Bowel disease   . Clotting disorder ([place])   . Complication of anesthesia    once with colonoscopy  . GERD (gastroesophageal reflux disease)   . Headache(784.0)   . Hx of blood clots   . IBS (irritable bowel syndrome)    remote history  . PONV (postoperative nausea  and vomiting)   . Supraventricular tachycardia ([place])   . Thrombosis of arm     ROS:   All systems reviewed and negative except as noted in the HPI.   Past Surgical History:  Procedure Laterality Date  . cyst left breast  1976  . CYSTECTOMY  1977   Left Breast   . SUPRAVENTRICULAR TACHYCARDIA ABLATION  09/29/2012   unsuccessful  . SUPRAVENTRICULAR TACHYCARDIA ABLATION N/A 09/29/2012   Procedure: SUPRAVENTRICULAR TACHYCARDIA ABLATION;  Surgeon: Evans Lance, MD;  [facility];  Service: Cardiovascular;  Laterality: N/A;     Family History  Problem Relation Age of Onset  . Colon cancer Mother     Late 74's to early 13's  . Colon polyps Mother   . Diabetes Mother   . Heart disease Mother   . Other Mother     Irritable Bowel Syndrome  . Diabetes Father   . Heart disease Father      Social History   Social History  . Marital status: Married    Spouse name: N/A  . Number of children: N/A  . Years of education: N/A   Occupational History  . Not on file.   Social History Main Topics  . Smoking status: Never Smoker  . Smokeless tobacco: Never Used  .  Alcohol use Yes     Comment: occasional 1 glass of wine 1-2 tims a month  . Drug use: No  . Sexual activity: Yes    Birth control/ protection: None   Other Topics Concern  . Not on file   Social History Narrative  . No narrative on file     BP 132/80   Pulse 63   Ht 5\' 4"  (1.626 m)   Wt 191 lb (86.6 kg)   BMI 32.79 kg/m   Physical Exam:  Well appearing 70 year old woman,NAD HEENT: Unremarkable Neck:  5 cm JVD, no thyromegally Back:  No CVA tenderness Lungs:  Clear with no wheezes, rales, or rhonchi. HEART:  Regular rate rhythm, no murmurs, no rubs, no clicks Abd:  soft, positive bowel sounds, no organomegally, no rebound, no guarding Ext:  2 plus pulses, no edema, no cyanosis, no clubbing Skin:  No rashes no nodules Neuro:  CN II through XII intact, motor grossly intact     Assess/Plan: 1. SVT - She appears to be doing well on flecainide and Nadolol. She will continue these. 2. HTN -  Her blood pressure is well controlled. Will follow.  3. Dyslipidemia -she will continue her Zetia. 4. Near syncope - etiology is unclear. We will schedule a treadmill stress test as her symptoms occur when she is walking. Will want to rule out hypotension with exertion or exercise induced arrhythmias.  [name].D.

## 2016-07-30 ENCOUNTER — Ambulatory Visit (INDEPENDENT_AMBULATORY_CARE_PROVIDER_SITE_OTHER): Payer: Medicare Other

## 2016-07-30 DIAGNOSIS — I471 Supraventricular tachycardia: Secondary | ICD-10-CM | POA: Diagnosis not present

## 2016-07-30 DIAGNOSIS — R55 Syncope and collapse: Secondary | ICD-10-CM

## 2016-07-30 LAB — EXERCISE TOLERANCE TEST
CHL CUP MPHR: 152 {beats}/min
CHL CUP RESTING HR STRESS: 71 {beats}/min
CSEPEDS: 30 s
CSEPPHR: 133 {beats}/min
Estimated workload: 9.3 METS
Exercise duration (min): 7 min
Percent HR: 87 %
RPE: 16

## 2016-08-07 NOTE — Progress Notes (Unsigned)
Called pt to review stress test results from 07/30/16. Results reviewed from Dr. Lovena Le. Normal stress test result. Pt is to continue Flecainide. Informed pt to call our office with any questions or concerns before next appointment. Pt verbalized understanding and agreed with plan. Pt thanks me for calling.

## 2017-03-17 ENCOUNTER — Other Ambulatory Visit: Payer: Self-pay | Admitting: Family Medicine

## 2017-03-17 DIAGNOSIS — Z1231 Encounter for screening mammogram for malignant neoplasm of breast: Secondary | ICD-10-CM

## 2017-04-02 ENCOUNTER — Ambulatory Visit: Payer: Medicare Other

## 2017-04-21 ENCOUNTER — Ambulatory Visit
Admission: RE | Admit: 2017-04-21 | Discharge: 2017-04-21 | Disposition: A | Discharge status: Home / Self Care | Payer: Medicare Other | Source: Ambulatory Visit | Attending: Family Medicine | Admitting: Family Medicine

## 2017-04-21 DIAGNOSIS — Z1231 Encounter for screening mammogram for malignant neoplasm of breast: Secondary | ICD-10-CM

## 2017-05-11 ENCOUNTER — Telehealth: Payer: Self-pay | Admitting: Internal Medicine

## 2017-05-11 NOTE — Telephone Encounter (Signed)
New Message  Pt call requesting to speak with RN. Pt states she has been having dizzy spells. Pt states she adjusted her medication but would like to speak with RN to see if she will need to be seen. Please call back to discuss

## 2017-05-11 NOTE — Telephone Encounter (Signed)
Called, pt unavailable. Tried both number listed. Left voice message to call back to our office.

## 2017-05-12 NOTE — Telephone Encounter (Signed)
Received incoming call. Pt stated she started having symptoms dizziness 3-4 months ago. Pt stated she has had 2 near syncopal episodes in the past 2 week. Pt stated she stopped taking the evening dose a couple of weeks ago, and dizziness was improved. Reviewed listed medications. Pt stated she is no longer taking Nadolol and only taking 50 mg of Flecainide in the morning. Inquired as to why pt is not taking Nadolol. Pt stated she was not aware to keep taking it. Pt stated Reviewed last ov from Dr. Lovena Le on 07/21/16. Assess/Plan:1. SVT - She appears to be doing well on flecainide and Nadolol. She will continue these. Informed will schedule pt for 05/19/17 at 10:30 AM with Dr. Lovena Le. Informed to call our if symptoms get worse prior to next appt. Pt verbalized understanding.

## 2017-05-14 ENCOUNTER — Encounter (HOSPITAL_BASED_OUTPATIENT_CLINIC_OR_DEPARTMENT_OTHER): Payer: Self-pay

## 2017-05-14 DIAGNOSIS — G4710 Hypersomnia, unspecified: Secondary | ICD-10-CM

## 2017-05-14 DIAGNOSIS — R0683 Snoring: Secondary | ICD-10-CM

## 2017-05-14 DIAGNOSIS — R5383 Other fatigue: Secondary | ICD-10-CM

## 2017-05-14 DIAGNOSIS — G4733 Obstructive sleep apnea (adult) (pediatric): Secondary | ICD-10-CM

## 2017-05-19 ENCOUNTER — Ambulatory Visit (INDEPENDENT_AMBULATORY_CARE_PROVIDER_SITE_OTHER): Payer: Medicare Other | Admitting: Internal Medicine

## 2017-05-19 ENCOUNTER — Encounter: Payer: Self-pay | Admitting: Internal Medicine

## 2017-05-19 VITALS — BP 132/78 | HR 71 | Ht 64.0 in | Wt 191.6 lb

## 2017-05-19 DIAGNOSIS — R61 Generalized hyperhidrosis: Secondary | ICD-10-CM | POA: Diagnosis not present

## 2017-05-19 DIAGNOSIS — I471 Supraventricular tachycardia: Secondary | ICD-10-CM

## 2017-05-19 NOTE — Progress Notes (Signed)
HPI Mrs. Moret returns today for followup. 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 She declined repeat ablation and underwent initiation of flecainide. She could not tolerate twice daily flecainide. She is taking 50 mg daily and hers symptoms are well controlled. She is a little orthostasis. She c/o snoring a lot and feeling hot and sweaty when she should not. She has not had any blood drawn. No change in weight. No constipation or diarrhea.  Allergies  Allergen Reactions  . Codeine Nausea Only    REACTION: adverse reaction  . Estrogens Other (See Comments)    Clotting   . Sulfonamide Derivatives Rash and Other (See Comments)    Fever, fluid retention      Current Outpatient Prescriptions  Medication Sig Dispense Refill  . aspirin 81 MG tablet Take 81 mg by mouth daily.    . Calcium Carb-Cholecalciferol (CALCIUM 1000 + D PO) Take 2 tablets by mouth daily.    . Cholecalciferol (VITAMIN D) 2000 units CAPS Take 1 capsule by mouth daily.    . flecainide (TAMBOCOR) 50 MG tablet Take 50 mg by mouth daily.    Marland Kitchen glucosamine-chondroitin 500-400 MG tablet Take 1 tablet by mouth daily.     . Multiple Vitamin (MULTIVITAMIN) tablet Take 1 tablet by mouth daily.    Marland Kitchen ZETIA 10 MG tablet Take 10 mg by mouth daily.     No current facility-administered medications for this visit.      Past Medical History:  Diagnosis Date  . Allergy   . Bowel disease   . Clotting disorder ([place])   . Complication of anesthesia    once with colonoscopy  . GERD (gastroesophageal reflux disease)   . Headache(784.0)   . Hx of blood clots   . IBS (irritable bowel syndrome)    remote history  . PONV (postoperative nausea and vomiting)   . Supraventricular tachycardia ([place])   . Thrombosis of arm     ROS:   All systems reviewed and negative except as noted in the HPI.   Past Surgical History:  Procedure Laterality Date  .  BREAST EXCISIONAL BIOPSY Left 1976   benign  . cyst left breast  1976  . CYSTECTOMY  1977   Left Breast   . SUPRAVENTRICULAR TACHYCARDIA ABLATION  09/29/2012   unsuccessful  . SUPRAVENTRICULAR TACHYCARDIA ABLATION N/A 09/29/2012   Procedure: SUPRAVENTRICULAR TACHYCARDIA ABLATION;  Surgeon: Evans Lance, MD;  Location: [facility] CATH LAB;  Service: Cardiovascular;  Laterality: N/A;     Family History  Problem Relation Age of Onset  . Colon cancer Mother        Late 81's to early 40's  . Colon polyps Mother   . Diabetes Mother   . Heart disease Mother   . Other Mother        Irritable Bowel Syndrome  . Diabetes Father   . Heart disease Father      Social History   Social History  . Marital status: Married    Spouse name: N/A  . Number of children: N/A  . Years of education: N/A   Occupational History  . Not on file.   Social History Main Topics  . Smoking status: Never Smoker  . Smokeless tobacco: Never Used  . Alcohol use Yes     Comment: occasional 1 glass of wine 1-2 tims a month  . Drug use: No  . Sexual  activity: Yes    Birth control/ protection: None   Other Topics Concern  . Not on file   Social History Narrative  . No narrative on file     BP 132/78   Pulse 71   Ht 5\' 4"  (1.626 m)   Wt 191 lb 9.6 oz (86.9 kg)   SpO2 98%   BMI 32.89 kg/m   Physical Exam:  Well appearing 71 year old woman,NAD HEENT: Unremarkable Neck:  5 cm JVD, no thyromegally Back:  No CVA tenderness Lungs:  Clear with no wheezes, rales, or rhonchi. HEART:  Regular rate rhythm, no murmurs, no rubs, no clicks Abd:  soft, positive bowel sounds, no organomegally, no rebound, no guarding Ext:  2 plus pulses, no edema, no cyanosis, no clubbing Skin:  No rashes no nodules Neuro:  CN II through XII intact, motor grossly intact    Assess/Plan: 1. SVT - She appears to be doing well on flecainide alone. She will continue this for now. She is encouraged to maintain a low caffeine  diet. 2. HTN -  Her blood pressure is well controlled. Will follow.  3. Dyslipidemia -she will continue her Zetia. 4. Snoring and sweating - will check a TSH. She is pending a sleep study.  Mikle Bosworth.D.

## 2017-05-19 NOTE — Patient Instructions (Signed)
Medication Instructions:  Your physician recommends that you continue on your current medications as directed. Please refer to the Current Medication list given to you today.   Labwork: TSH and T4 today.  Testing/Procedures: None ordered.   Follow-Up: Your physician wants you to follow-up in: one year with Dr. Lovena Le.   You will receive a reminder letter in the mail two months in advance. If you don't receive a letter, please call our office to schedule the follow-up appointment.   Any Other Special Instructions Will Be Listed Below (If Applicable).     If you need a refill on your cardiac medications before your next appointment, please call your pharmacy.

## 2017-05-20 LAB — T4, FREE: Free T4: 1.07 ng/dL (ref 0.82–1.77)

## 2017-05-20 LAB — TSH: TSH: 2.14 u[IU]/mL (ref 0.450–4.500)

## 2017-06-01 ENCOUNTER — Other Ambulatory Visit: Payer: Self-pay | Admitting: Internal Medicine

## 2017-07-10 ENCOUNTER — Ambulatory Visit (HOSPITAL_BASED_OUTPATIENT_CLINIC_OR_DEPARTMENT_OTHER): Payer: Medicare Other | Attending: Family Medicine | Admitting: Internal Medicine

## 2017-07-10 ENCOUNTER — Telehealth: Payer: Self-pay | Admitting: Internal Medicine

## 2017-07-10 VITALS — Ht 64.0 in | Wt 188.0 lb

## 2017-07-10 DIAGNOSIS — R0681 Apnea, not elsewhere classified: Secondary | ICD-10-CM | POA: Insufficient documentation

## 2017-07-10 DIAGNOSIS — G4761 Periodic limb movement disorder: Secondary | ICD-10-CM | POA: Diagnosis not present

## 2017-07-10 DIAGNOSIS — R0683 Snoring: Secondary | ICD-10-CM | POA: Insufficient documentation

## 2017-07-10 DIAGNOSIS — G4710 Hypersomnia, unspecified: Secondary | ICD-10-CM | POA: Diagnosis present

## 2017-07-10 DIAGNOSIS — G4733 Obstructive sleep apnea (adult) (pediatric): Secondary | ICD-10-CM

## 2017-07-10 NOTE — Telephone Encounter (Signed)
New message   Pt calling to speak with Dr. Lovena Le nurse. She states that she has Svt and she recently had 2 little episodes on Saturday and one this morning. She wanted to let someone know and that she will be going back to taking her medication twice per day instead of once per day.

## 2017-07-14 ENCOUNTER — Other Ambulatory Visit (HOSPITAL_BASED_OUTPATIENT_CLINIC_OR_DEPARTMENT_OTHER): Payer: Self-pay

## 2017-07-14 DIAGNOSIS — G4733 Obstructive sleep apnea (adult) (pediatric): Secondary | ICD-10-CM

## 2017-07-14 DIAGNOSIS — R0683 Snoring: Secondary | ICD-10-CM

## 2017-07-14 DIAGNOSIS — R5383 Other fatigue: Secondary | ICD-10-CM

## 2017-07-14 DIAGNOSIS — G4710 Hypersomnia, unspecified: Secondary | ICD-10-CM

## 2017-07-14 NOTE — Telephone Encounter (Signed)
Call placed to Pt.  Pt states had a few recurrences of SVT so she took an addl flecainide 50 mg tablet.  States SVT was controlled within 45 minutes after taking 2nd tablet.  Pt had changed to once daily d/t dizziness on the BID dosing.  Pt states she is doing ok now on the BID dosing, notified to call our office if she needs to be seen by Dr. Lovena Le for med change.  Pt indicates understanding.

## 2017-07-19 DIAGNOSIS — G4733 Obstructive sleep apnea (adult) (pediatric): Secondary | ICD-10-CM

## 2017-07-19 NOTE — Procedures (Signed)
   Patient Name: [name], [name] Date: 07/10/2017 Gender: Female D.O.B: 08-26-1946 Age (years): 70 Referring Provider: Claris Gower Height (inches): 64 Interpreting Physician: Baird Lyons MD, ABSM Weight (lbs): 188 RPSGT: Jorge Ny BMI: 32 MRN: [id number] Neck Size: 15.00 CLINICAL INFORMATION Sleep Study Type: NPSG  Indication for sleep study: Excessive Daytime Sleepiness, Fatigue, OSA, Snoring, Witnessed Apneas  Epworth Sleepiness Score: 5  SLEEP STUDY TECHNIQUE As per the AASM Manual for the Scoring of Sleep and Associated Events v2.3 (April 2016) with a hypopnea requiring 4% desaturations.  The channels recorded and monitored were frontal, central and occipital EEG, electrooculogram (EOG), submentalis EMG (chin), nasal and oral airflow, thoracic and abdominal wall motion, anterior tibialis EMG, snore microphone, electrocardiogram, and pulse oximetry.  MEDICATIONS Medications self-administered by patient taken the night of the study : none reported  SLEEP ARCHITECTURE The study was initiated at 10:02:32 PM and ended at 4:49:06 AM.  Sleep onset time was 11.1 minutes and the sleep efficiency was 87.6%. The total sleep time was 356.0 minutes.  Stage REM latency was 61.0 minutes.  The patient spent 3.79% of the night in stage N1 sleep, 64.19% in stage N2 sleep, 14.47% in stage N3 and 17.56% in REM.  Alpha intrusion was absent.  Supine sleep was 22.47%.  RESPIRATORY PARAMETERS The overall apnea/hypopnea index (AHI) was 2.9 per hour. There were 4 total apneas, including 4 obstructive, 0 central and 0 mixed apneas. There were 13 hypopneas and 29 RERAs.  The AHI during Stage REM sleep was 13.4 per hour.  AHI while supine was 6.8 per hour.  The mean oxygen saturation was 92.62%. The minimum SpO2 during sleep was 80.00%.  Moderate snoring was noted during this study.  CARDIAC DATA The 2 lead EKG demonstrated sinus rhythm. The mean heart rate was 54.00  beats per minute. Other EKG findings include: PVCs. LEG MOVEMENT DATA The total PLMS were 699 with a resulting PLMS index of 117.81. Associated arousal with leg movement index was 9.4 .  IMPRESSIONS - No significant obstructive sleep apnea occurred during this study (AHI = 2.9/h). - Mild sleep disturbance by respiratory events with arousals not qualifying as apneas (RERAS)- Upper Airway Resistance Syndrome - No significant central sleep apnea occurred during this study (CAI = 0.0/h). - Moderate oxygen desaturation was noted during this study (Min O2 = 80.00%), Mean 96.2%. - The patient snored with Moderate snoring volume. - EKG findings include PVCs. - Severe periodic limb movements of sleep occurred during the study. Associated arousals were significant.  DIAGNOSIS - Periodic Limb Movement Syndrome (327.51 [G47.61 ICD-10])  RECOMMENDATIONS - Consider trial of specific therapy for limb movement sleep disturbance, such as Requip or Mirapex. - Be careful with alcohol, sedatives and other CNS depressants that may worsen sleep apnea and disrupt normal sleep architecture. - Sleep hygiene should be reviewed to assess factors that may improve sleep quality. - Weight management and regular exercise should be initiated or continued if appropriate.  [Electronically signed] 07/19/2017 03:39 PM  Baird Lyons MD, [place], American Board of Sleep Medicine   NPI: [id number]  [place], American Board of Sleep Medicine  ELECTRONICALLY SIGNED ON:  07/19/2017, 10:45 AM [place] PH: [phone]   FX: [phone] [place]

## 2017-12-03 ENCOUNTER — Other Ambulatory Visit: Payer: Self-pay | Admitting: Family Medicine

## 2018-04-30 ENCOUNTER — Other Ambulatory Visit: Payer: Self-pay | Admitting: Family Medicine

## 2018-04-30 DIAGNOSIS — Z1231 Encounter for screening mammogram for malignant neoplasm of breast: Secondary | ICD-10-CM

## 2018-05-06 DIAGNOSIS — M25552 Pain in left hip: Secondary | ICD-10-CM

## 2018-05-06 DIAGNOSIS — M25551 Pain in right hip: Secondary | ICD-10-CM

## 2018-05-06 HISTORY — DX: Pain in right hip: M25.551

## 2018-05-06 HISTORY — DX: Pain in left hip: M25.552

## 2018-05-27 ENCOUNTER — Ambulatory Visit
Admission: RE | Admit: 2018-05-27 | Discharge: 2018-05-27 | Disposition: A | Discharge status: Home / Self Care | Payer: Medicare Other | Source: Ambulatory Visit | Attending: Family Medicine | Admitting: Family Medicine

## 2018-05-27 DIAGNOSIS — Z1231 Encounter for screening mammogram for malignant neoplasm of breast: Secondary | ICD-10-CM

## 2018-07-07 ENCOUNTER — Other Ambulatory Visit: Payer: Self-pay | Admitting: Internal Medicine

## 2018-08-20 ENCOUNTER — Other Ambulatory Visit: Payer: Self-pay | Admitting: Internal Medicine

## 2018-09-30 ENCOUNTER — Ambulatory Visit: Payer: Medicare Other | Admitting: Internal Medicine

## 2018-10-20 ENCOUNTER — Encounter: Payer: Self-pay | Admitting: Internal Medicine

## 2018-10-20 ENCOUNTER — Ambulatory Visit: Payer: Medicare Other | Admitting: Internal Medicine

## 2018-10-20 VITALS — BP 128/60 | HR 74 | Ht 64.0 in | Wt 189.0 lb

## 2018-10-20 DIAGNOSIS — I471 Supraventricular tachycardia: Secondary | ICD-10-CM

## 2018-10-20 MED ORDER — FLECAINIDE ACETATE 50 MG PO TABS: ORAL_TABLET | ORAL | 3 refills | Status: DC

## 2018-10-20 NOTE — Patient Instructions (Signed)

## 2018-10-20 NOTE — Progress Notes (Signed)
HPI [name] returns today for followup. 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 She declined repeat ablation and underwent initiation of flecainide. She could not tolerate twice daily flecainide. She is taking 50 mg daily and her symptoms are well controlled. Since her last visit she appears to have 5-6 episodes of SVT at 150-160/min.   Allergies  Allergen Reactions  . Codeine Nausea Only    REACTION: adverse reaction  . Estrogens Other (See Comments)    Clotting   . Sulfonamide Derivatives Rash and Other (See Comments)    Fever, fluid retention      Current Outpatient Medications  Medication Sig Dispense Refill  . aspirin 81 MG tablet Take 81 mg by mouth daily.    [name] Kitchen b complex vitamins tablet Take 1 tablet by mouth daily.    . Calcium Carb-Cholecalciferol (CALCIUM 1000 + D PO) Take 2 tablets by mouth daily.    . Cholecalciferol (VITAMIN D) 2000 units CAPS Take 1 capsule by mouth daily.    . flecainide (TAMBOCOR) 50 MG tablet Take 50 mg by mouth daily.    [name] Kitchen glucosamine-chondroitin 500-400 MG tablet Take 1 tablet by mouth daily.     . Multiple Vitamin (MULTIVITAMIN) tablet Take 1 tablet by mouth daily.     No current facility-administered medications for this visit.      Past Medical History:  Diagnosis Date  . Allergy   . Bowel disease   . Clotting disorder ([place])   . Complication of anesthesia    once with colonoscopy  . GERD (gastroesophageal reflux disease)   . Headache(784.0)   . Hx of blood clots   . IBS (irritable bowel syndrome)    remote history  . PONV (postoperative nausea and vomiting)   . Supraventricular tachycardia ([place])   . Thrombosis of arm     ROS:   All systems reviewed and negative except as noted in the HPI.   Past Surgical History:  Procedure Laterality Date  . BREAST EXCISIONAL BIOPSY Left 1976   benign  . cyst left breast  1976  . CYSTECTOMY  1977   Left Breast   . SUPRAVENTRICULAR TACHYCARDIA ABLATION  09/29/2012   unsuccessful  . SUPRAVENTRICULAR TACHYCARDIA ABLATION N/A 09/29/2012   Procedure: SUPRAVENTRICULAR TACHYCARDIA ABLATION;  Surgeon: Evans Lance, MD;  Location: [facility] CATH LAB;  Service: Cardiovascular;  Laterality: N/A;     Family History  Problem Relation Age of Onset  . Colon cancer Mother        Late 48's to early 54's  . Colon polyps Mother   . Diabetes Mother   . Heart disease Mother   . Other Mother        Irritable Bowel Syndrome  . Diabetes Father   . Heart disease Father      Social History   Socioeconomic History  . Marital status: Married    Spouse name: Not on file  . Number of children: Not on file  . Years of education: Not on file  . Highest education level: Not on file  Occupational History  . Not on file  Social Needs  . Financial resource strain: Not on file  . Food insecurity:    Worry: Not on file    Inability: Not on file  . Transportation needs:    Medical: Not on file    Non-medical: Not on file  Tobacco Use  .  Smoking status: Never Smoker  . Smokeless tobacco: Never Used  Substance and Sexual Activity  . Alcohol use: Yes    Comment: occasional 1 glass of wine 1-2 tims a month  . Drug use: No  . Sexual activity: Yes    Birth control/protection: None  Lifestyle  . Physical activity:    Days per week: Not on file    Minutes per session: Not on file  . Stress: Not on file  Relationships  . Social connections:    Talks on phone: Not on file    Gets together: Not on file    Attends religious service: Not on file    Active member of club or organization: Not on file    Attends meetings of clubs or organizations: Not on file    Relationship status: Not on file  . Intimate partner violence:    Fear of current or ex partner: Not on file    Emotionally abused: Not on file    Physically abused: Not on file    Forced sexual activity: Not on file  Other Topics Concern  .  Not on file  Social History Narrative  . Not on file     BP 128/60   Pulse 74   Ht 5\' 4"  (1.626 m)   Wt 189 lb (85.7 kg)   BMI 32.44 kg/m   Physical Exam:  Well appearing 72 yo woman, NAD HEENT: Unremarkable Neck:  6 cm JVD, no thyromegally Lymphatics:  No adenopathy Back:  No CVA tenderness Lungs:  Clear with no wheezes HEART:  Regular rate rhythm, no murmurs, no rubs, no clicks Abd:  soft, positive bowel sounds, no organomegally, no rebound, no guarding Ext:  2 plus pulses, no edema, no cyanosis, no clubbing Skin:  No rashes no nodules Neuro:  CN II through XII intact, motor grossly intact  EKG - nsr   Assess/Plan: 1. SVT - I have discussed the treatment options and she will continue her low dose flecainide and will undergo watchful waiting. 2. HTN - her blood pressure is controlled. We will follow.  Mikle Bosworth.D.

## 2019-04-27 ENCOUNTER — Other Ambulatory Visit: Payer: Self-pay | Admitting: Family Medicine

## 2019-04-27 DIAGNOSIS — Z1231 Encounter for screening mammogram for malignant neoplasm of breast: Secondary | ICD-10-CM

## 2019-06-09 ENCOUNTER — Other Ambulatory Visit: Payer: Self-pay

## 2019-06-09 ENCOUNTER — Ambulatory Visit
Admission: RE | Admit: 2019-06-09 | Discharge: 2019-06-09 | Disposition: A | Discharge status: Home / Self Care | Payer: Medicare Other | Source: Ambulatory Visit | Attending: Family Medicine | Admitting: Family Medicine

## 2019-06-09 DIAGNOSIS — Z1231 Encounter for screening mammogram for malignant neoplasm of breast: Secondary | ICD-10-CM

## 2019-11-07 ENCOUNTER — Encounter: Payer: Self-pay | Admitting: *Deleted

## 2019-11-17 ENCOUNTER — Encounter: Payer: Self-pay | Admitting: Internal Medicine

## 2019-11-17 ENCOUNTER — Ambulatory Visit: Payer: Medicare PPO | Admitting: Internal Medicine

## 2019-11-17 ENCOUNTER — Other Ambulatory Visit: Payer: Self-pay

## 2019-11-17 VITALS — BP 124/78 | HR 67 | Ht 64.0 in | Wt 192.0 lb

## 2019-11-17 DIAGNOSIS — I471 Supraventricular tachycardia, unspecified: Secondary | ICD-10-CM

## 2019-11-17 MED ORDER — FLECAINIDE ACETATE 50 MG PO TABS: ORAL_TABLET | ORAL | 3 refills | Status: DC

## 2019-11-17 NOTE — Patient Instructions (Signed)

## 2019-11-17 NOTE — Progress Notes (Signed)
HPI Ms. [name] returns today for followup. She has a h/o SVT and is s/p catheter ablation. She has been placed on flecainide with fairly good control. She takes her flecainide at night. She takes additional flecainide as needed. She has had a rare episode of sustained palpitations.  Allergies  Allergen Reactions  . Codeine Nausea Only    REACTION: adverse reaction  . Estrogens Other (See Comments)    Clotting   . Sulfonamide Derivatives Rash and Other (See Comments)    Fever, fluid retention      Current Outpatient Medications  Medication Sig Dispense Refill  . aspirin 81 MG tablet Take 81 mg by mouth daily.    Marland Kitchen b complex vitamins tablet Take 1 tablet by mouth daily.    . Calcium Carb-Cholecalciferol (CALCIUM 1000 + D PO) Take 2 tablets by mouth daily.    . Cholecalciferol (VITAMIN D) 2000 units CAPS Take 1 capsule by mouth daily.    . flecainide (TAMBOCOR) 50 MG tablet Take daily and as directed by physician 135 tablet 3  . glucosamine-chondroitin 500-400 MG tablet Take 1 tablet by mouth daily.     . Multiple Vitamin (MULTIVITAMIN) tablet Take 1 tablet by mouth daily.     No current facility-administered medications for this visit.     Past Medical History:  Diagnosis Date  . Allergy   . Clotting disorder ([place])   . Complication of anesthesia    once with colonoscopy  . DIAPHORESIS 08/09/2010   Qualifier: Diagnosis of  By: Caryl Comes, MD, Remus Blake   . GERD (gastroesophageal reflux disease)   . Headache(784.0)   . HEMORRHOIDS, INTERNAL 06/21/2008   Qualifier: Diagnosis of  By: Nelson-Smith CMA (AAMA), Dottie    . Hx of blood clots   . Hypercoagulable state ([employer]) 09/16/2012  . HYPERLIPIDEMIA 05/10/2008   Qualifier: Diagnosis of  By: Julaine Hua CMA ([place]), Estill Bamberg    . IBS (irritable bowel syndrome)    remote history  . Pain in joint of right hip 05/06/2018  . Pain of left hip joint 05/06/2018  . Palpitations 08/09/2010   Qualifier: Diagnosis of  By: Caryl Comes, MD,  Remus Blake   . PONV (postoperative nausea and vomiting)   . 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   Qualifier: Diagnosis of  By: Hollie Salk CMA, Estill Bamberg    . Thrombosis of arm     ROS:   All systems reviewed and negative except as noted in the HPI.   Past Surgical History:  Procedure Laterality Date  . BREAST EXCISIONAL BIOPSY Left 1976   benign  . cyst left breast  1976  . CYSTECTOMY  1977   Left Breast   . SUPRAVENTRICULAR TACHYCARDIA ABLATION  09/29/2012   unsuccessful  . SUPRAVENTRICULAR TACHYCARDIA ABLATION N/A 09/29/2012   Procedure: SUPRAVENTRICULAR TACHYCARDIA ABLATION;  Surgeon: Evans Lance, MD;  Location[facility] C[facility] LAB;  Service: Cardiovascular;  Laterality: N/A;     Family History  Problem Relation Age of Onset  . Colon cancer Mother        Late 59's to early 46's  . Colon polyps Mother   . Diabetes Mother   . Heart disease Mother   . Other Mother        Irritable Bowel Syndrome  . Diabetes Father   . Heart disease Father      Social History   Socioeconomic History  . Marital status: Married    Spouse name: Not on file  . Number of children: Not  on file  . Years of education: Not on file  . Highest education level: Not on file  Occupational History  . Not on file  Tobacco Use  . Smoking status: Never Smoker  . Smokeless tobacco: Never Used  Substance and Sexual Activity  . Alcohol use: Yes    Comment: occasional 1 glass of wine 1-2 tims a month  . Drug use: No  . Sexual activity: Yes    Birth control/protection: None  Other Topics Concern  . Not on file  Social History Narrative  . Not on file   Social Determinants of Health   Financial Resource Strain:   . Difficulty of Paying Living Expenses: Not on file  Food Insecurity:   . Worried About Charity fundraiser in the Last Year: Not on file  . Ran Out of Food in the Last Year: Not on file  Transportation Needs:   . Lack of Transportation (Medical): Not on file  . Lack of  Transportation (Non-Medical): Not on file  Physical Activity:   . Days of Exercise per Week: Not on file  . Minutes of Exercise per Session: Not on file  Stress:   . Feeling of Stress : Not on file  Social Connections:   . Frequency of Communication with Friends and Family: Not on file  . Frequency of Social Gatherings with Friends and Family: Not on file  . Attends Religious Services: Not on file  . Active Member of Clubs or Organizations: Not on file  . Attends Archivist Meetings: Not on file  . Marital Status: Not on file  Intimate Partner Violence:   . Fear of Current or Ex-Partner: Not on file  . Emotionally Abused: Not on file  . Physically Abused: Not on file  . Sexually Abused: Not on file     BP 124/78   Pulse 67   Ht 5\' 4"  (1.626 m)   Wt 192 lb (87.1 kg)   SpO2 97%   BMI 32.96 kg/m   Physical Exam:  Well appearing NAD HEENT: Unremarkable Neck:  No JVD, no thyromegally Lymphatics:  No adenopathy Back:  No CVA tenderness Lungs:  Clear with no wheezes HEART:  Regular rate rhythm, no murmurs, no rubs, no clicks Abd:  soft, positive bowel sounds, no organomegally, no rebound, no guarding Ext:  2 plus pulses, no edema, no cyanosis, no clubbing Skin:  No rashes no nodules Neuro:  CN II through XII intact, motor grossly intact  EKG - nsr  Assess/Plan: 1. SVT - she will continue her low dose flecainide. We discussed taking additional flecainide. She will take additional doses as needed. 2. HTN - her bp is controlled. She is encouraged to lose weight and avoid salty food.   Mikle Bosworth.D.

## 2019-12-08 ENCOUNTER — Ambulatory Visit: Payer: Medicare PPO

## 2019-12-09 IMAGING — MG DIGITAL SCREENING BILATERAL MAMMOGRAM WITH TOMO AND CAD
8 series · 8 of 24 positions shown · non-contrast
Comparison: Previous exam(s).

CLINICAL DATA: Screening.

EXAM:
DIGITAL SCREENING BILATERAL MAMMOGRAM WITH TOMO AND CAD

[R MLO synth-2D]
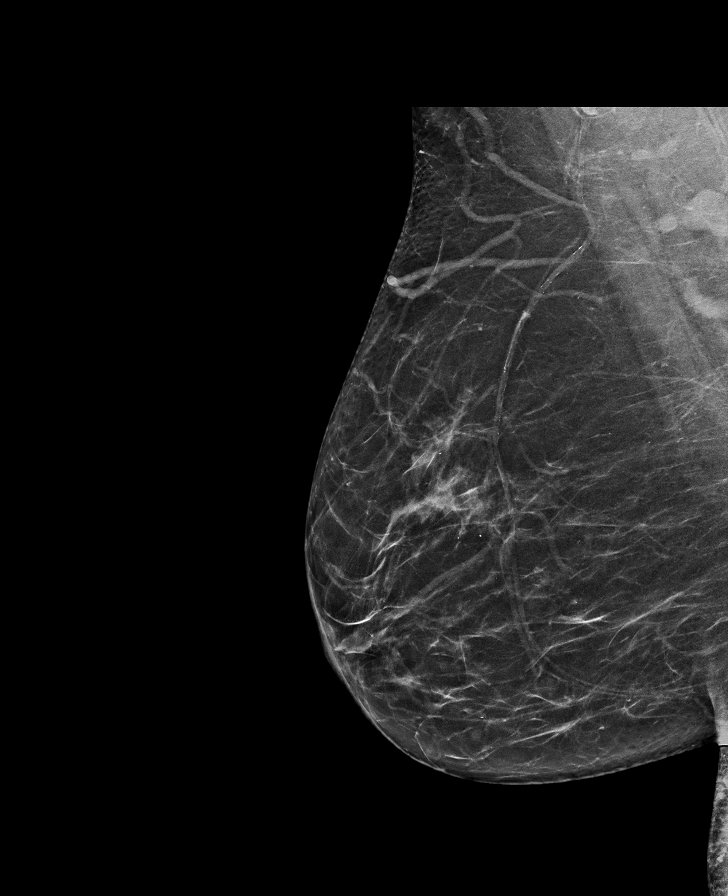

[L MLO synth-2D]
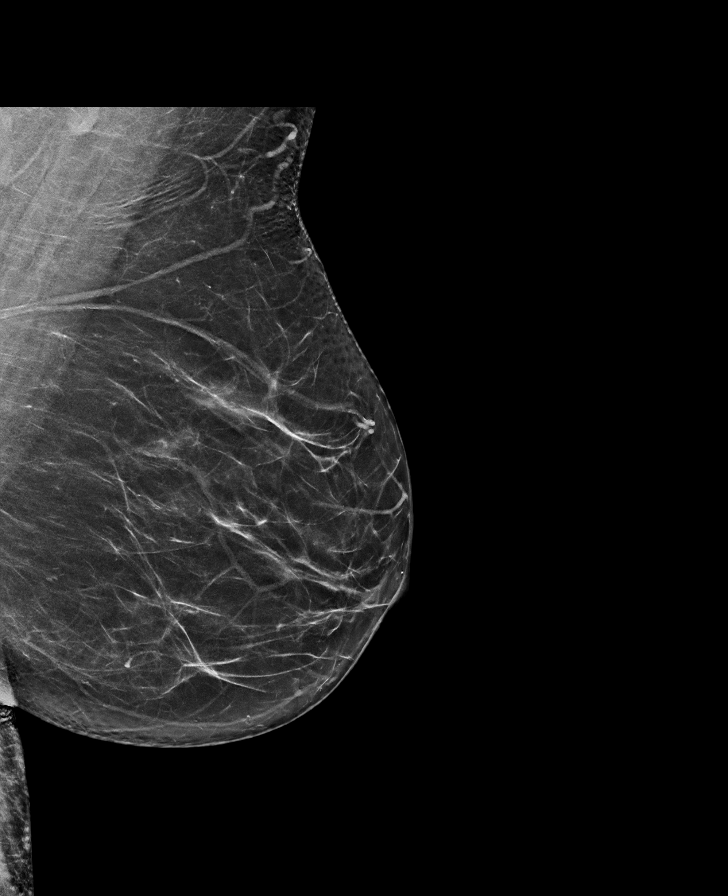

[L CC synth-2D]
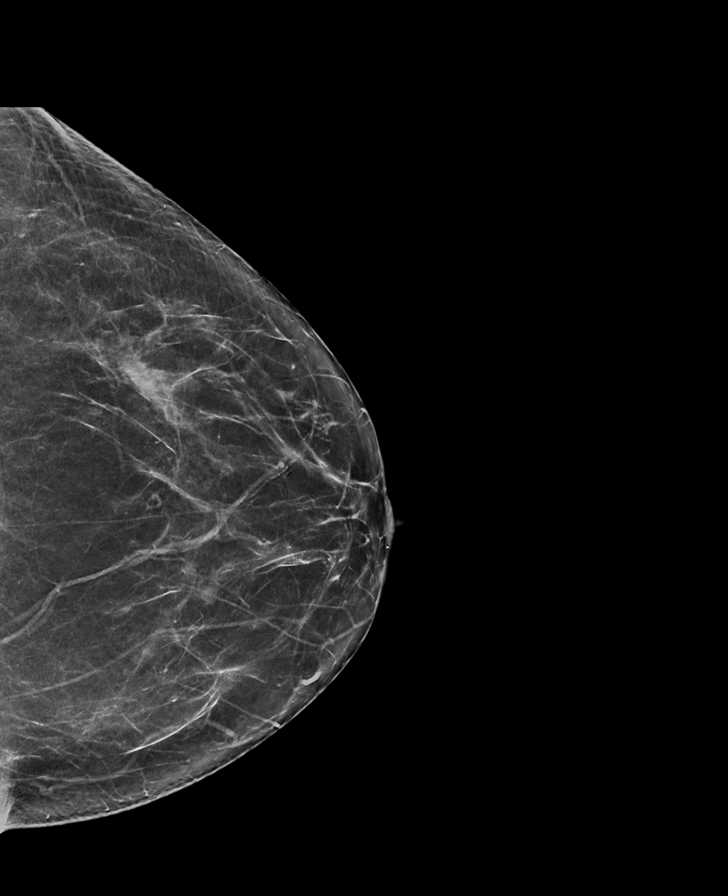

[R CC synth-2D]
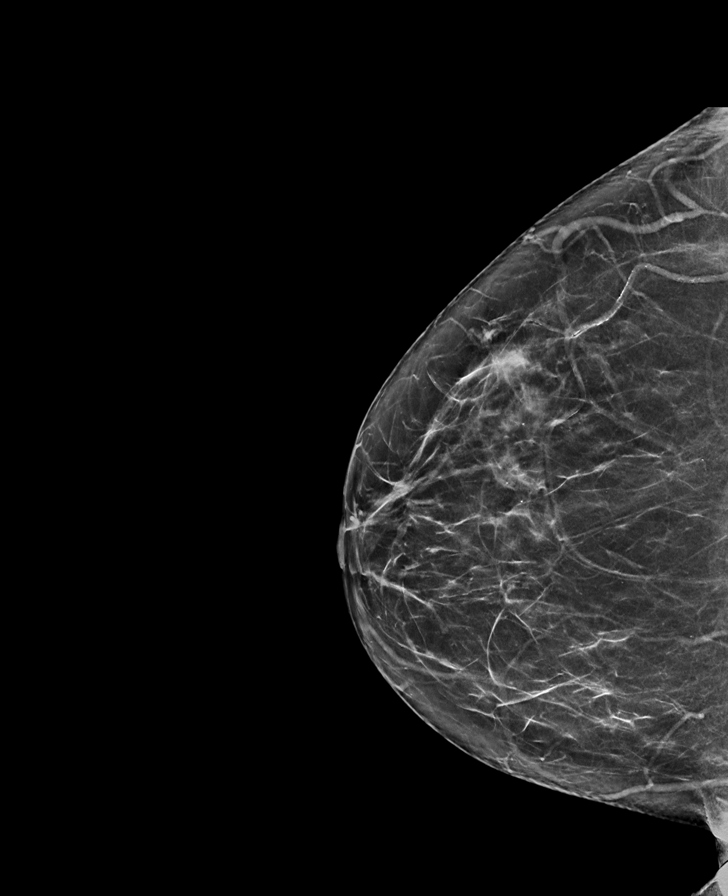

[L CC tomo · tomo slice 35/68.0]
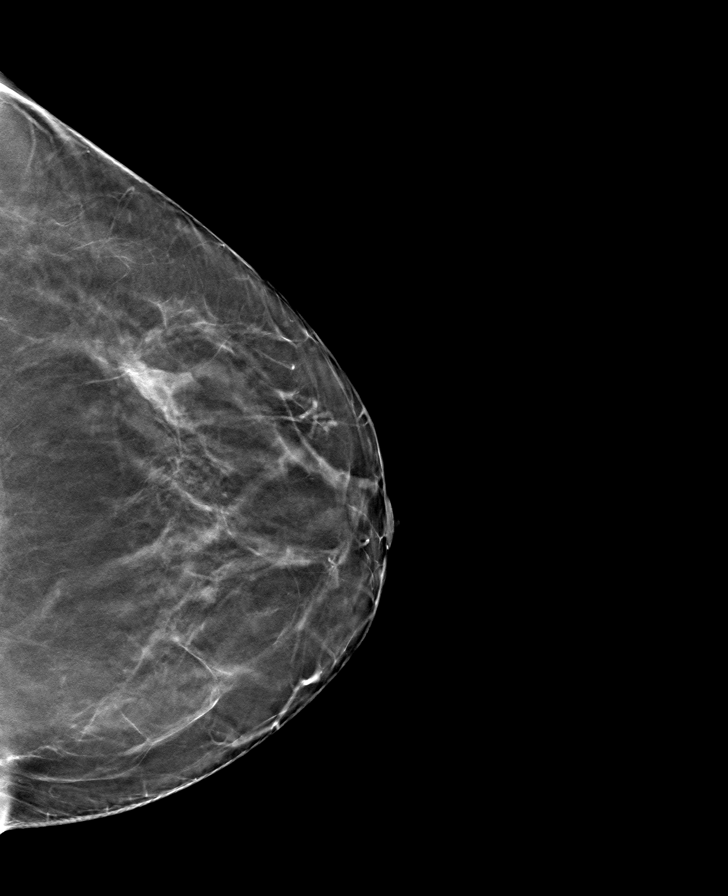

[R MLO tomo · tomo slice 36/71.0]
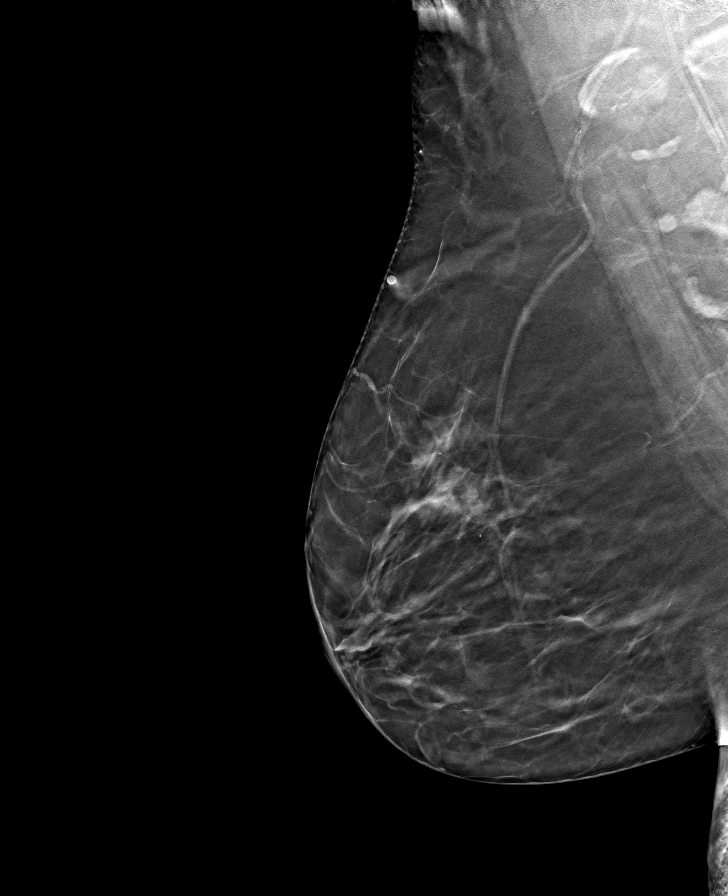

[R CC tomo · tomo slice 35/70.0]
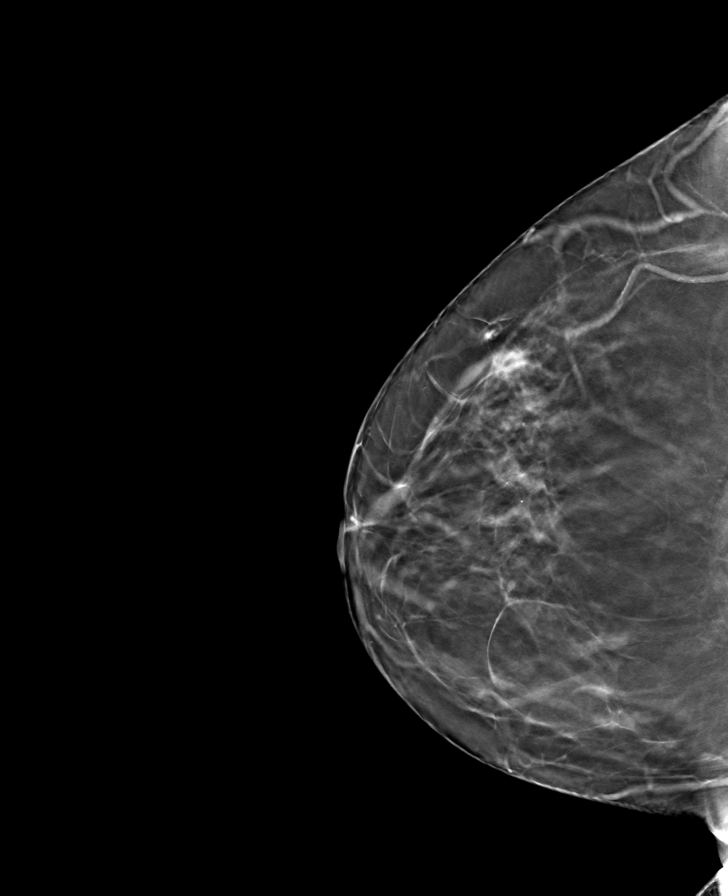

[L MLO tomo · tomo slice 37/73.0]
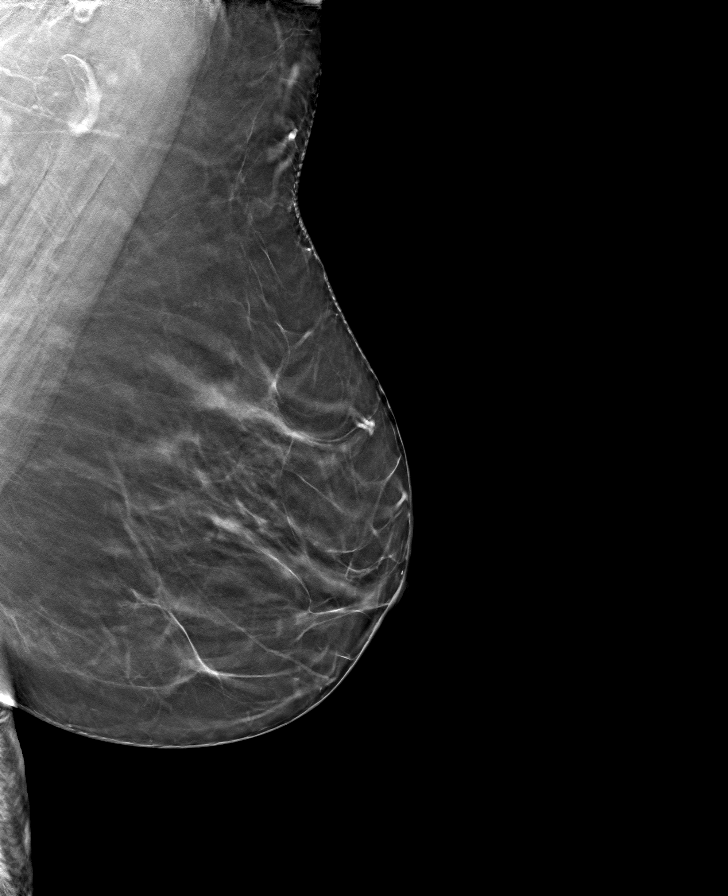

[8 of 24 positions shown; findings below may reference images not displayed]

ACR Breast Density Category b: There are scattered areas of
fibroglandular density.
FINDINGS: There are no findings suspicious for malignancy. Images were
processed with CAD.
IMPRESSION: No mammographic evidence of malignancy. A result letter of this
screening mammogram will be mailed directly to the patient.

RECOMMENDATION:
Screening mammogram in one year. (Code:CN-U-775)

BI-RADS CATEGORY  1: Negative.

## 2019-12-17 ENCOUNTER — Ambulatory Visit: Payer: Medicare PPO | Attending: Internal Medicine

## 2019-12-17 DIAGNOSIS — Z23 Encounter for immunization: Secondary | ICD-10-CM | POA: Insufficient documentation

## 2019-12-17 NOTE — Progress Notes (Signed)
   Covid-19 Vaccination Clinic  Name:  [name]    MRN: [id number] DOB: 1946/07/17  12/17/2019  [name] was observed post Covid-19 immunization for 15 minutes without incidence. She was provided with Vaccine Information Sheet and instruction to access the V-Safe system.   [name] was instructed to call 911 with any severe reactions post vaccine: Marland Kitchen Difficulty breathing  . Swelling of your face and throat  . A fast heartbeat  . A bad rash all over your body  . Dizziness and weakness    Immunizations Administered    Name Date Dose VIS Date Route   Pfizer COVID-19 Vaccine 12/17/2019  8:28 AM 0.3 mL 10/21/2019 Intramuscular   Manufacturer: [place]   Lot: [id number]   [place]: [id number]

## 2019-12-19 ENCOUNTER — Ambulatory Visit: Payer: Medicare PPO

## 2020-01-11 ENCOUNTER — Ambulatory Visit: Payer: Medicare PPO | Attending: Internal Medicine

## 2020-01-11 DIAGNOSIS — Z23 Encounter for immunization: Secondary | ICD-10-CM | POA: Insufficient documentation

## 2020-01-11 NOTE — Progress Notes (Signed)
   Covid-19 Vaccination Clinic  Name:  [name]    MRN: [id number] DOB: [birth year]  01/11/2020  [name] was observed post Covid-19 immunization for 15 minutes without incident. She was provided with Vaccine Information Sheet and instruction to access the V-Safe system.   [name] was instructed to call 911 with any severe reactions post vaccine: Marland Kitchen Difficulty breathing  . Swelling of face and throat  . A fast heartbeat  . A bad rash all over body  . Dizziness and weakness   Immunizations Administered    Name Date Dose VIS Date Route   Pfizer COVID-19 Vaccine 01/11/2020  8:17 AM 0.3 mL 10/21/2019 Intramuscular   Manufacturer: [place]   Lot: [id number]   [place]: [id number]

## 2020-05-07 ENCOUNTER — Other Ambulatory Visit: Payer: Self-pay | Admitting: Family Medicine

## 2020-05-07 DIAGNOSIS — Z1231 Encounter for screening mammogram for malignant neoplasm of breast: Secondary | ICD-10-CM

## 2020-06-12 ENCOUNTER — Other Ambulatory Visit: Payer: Self-pay

## 2020-06-12 ENCOUNTER — Ambulatory Visit
Admission: RE | Admit: 2020-06-12 | Discharge: 2020-06-12 | Disposition: A | Discharge status: Home / Self Care | Payer: Medicare PPO | Source: Ambulatory Visit | Attending: Family Medicine | Admitting: Family Medicine

## 2020-06-12 DIAGNOSIS — Z1231 Encounter for screening mammogram for malignant neoplasm of breast: Secondary | ICD-10-CM

## 2020-11-16 ENCOUNTER — Ambulatory Visit: Payer: Medicare PPO | Admitting: Internal Medicine

## 2020-11-30 ENCOUNTER — Other Ambulatory Visit: Payer: Self-pay | Admitting: Internal Medicine

## 2020-12-03 ENCOUNTER — Ambulatory Visit: Payer: Medicare PPO | Admitting: Internal Medicine

## 2020-12-03 ENCOUNTER — Encounter: Payer: Self-pay | Admitting: Internal Medicine

## 2020-12-03 ENCOUNTER — Other Ambulatory Visit: Payer: Self-pay

## 2020-12-03 VITALS — BP 126/72 | HR 66 | Ht 64.0 in | Wt 186.6 lb

## 2020-12-03 DIAGNOSIS — I471 Supraventricular tachycardia: Secondary | ICD-10-CM

## 2020-12-03 MED ORDER — FLECAINIDE ACETATE 50 MG PO TABS: 50.0000 mg | ORAL_TABLET | Freq: Two times a day (BID) | ORAL | 3 refills | Status: DC

## 2020-12-03 NOTE — Progress Notes (Signed)
HPI [name] returns today for followup. She has a h/o SVT and is s/p catheter ablation 8 years ago. She had recurrent SVT about 5 years ago. She has been placed on flecainide with fairly good control. She takes her flecainide at night. She takes additional flecainide as needed. She has had a rare episode of sustained palpitations. She thinks 5 in the last year. She has been under more stress as her 73 yo father has moved in with her. Allergies  Allergen Reactions  . Codeine Nausea Only    REACTION: adverse reaction  . Estrogens Other (See Comments)    Clotting   . Sulfonamide Derivatives Rash and Other (See Comments)    Fever, fluid retention      Current Outpatient Medications  Medication Sig Dispense Refill  . aspirin 81 MG tablet Take 81 mg by mouth daily.    Marland Kitchen b complex vitamins tablet Take 1 tablet by mouth daily.    . Calcium Carb-Cholecalciferol (CALCIUM 1000 + D PO) Take 2 tablets by mouth daily.    Marland Kitchen glucosamine-chondroitin 500-400 MG tablet Take 1 tablet by mouth daily.     . Multiple Vitamin (MULTIVITAMIN) tablet Take 1 tablet by mouth daily.    . flecainide (TAMBOCOR) 50 MG tablet Take 1 tablet (50 mg total) by mouth 2 (two) times daily. 180 tablet 3   No current facility-administered medications for this visit.     Past Medical History:  Diagnosis Date  . Allergy   . Clotting disorder ([place])   . Complication of anesthesia    once with colonoscopy  . DIAPHORESIS 08/09/2010   Qualifier: Diagnosis of  By: Caryl Comes, MD, Remus Blake   . GERD (gastroesophageal reflux disease)   . Headache(784.0)   . HEMORRHOIDS, INTERNAL 06/21/2008   Qualifier: Diagnosis of  By: Nelson-Smith CMA (AAMA), Dottie    . Hx of blood clots   . Hypercoagulable state ([place]) 09/16/2012  . HYPERLIPIDEMIA 05/10/2008   Qualifier: Diagnosis of  By: Julaine Hua CMA ([place]), Estill Bamberg    . IBS (irritable bowel syndrome)    remote history  . Pain in joint of right hip 05/06/2018  . Pain of  left hip joint 05/06/2018  . Palpitations 08/09/2010   Qualifier: Diagnosis of  By: Caryl Comes, MD, Remus Blake   . PONV (postoperative nausea and vomiting)   . 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   Qualifier: Diagnosis of  By: Hollie Salk CMA, Estill Bamberg    . Thrombosis of arm     ROS:   All systems reviewed and negative except as noted in the HPI.   Past Surgical History:  Procedure Laterality Date  . BREAST EXCISIONAL BIOPSY Left 1976   benign  . cyst left breast  1976  . CYSTECTOMY  1977   Left Breast   . SUPRAVENTRICULAR TACHYCARDIA ABLATION  09/29/2012   unsuccessful  . SUPRAVENTRICULAR TACHYCARDIA ABLATION N/A 09/29/2012   Procedure: SUPRAVENTRICULAR TACHYCARDIA ABLATION;  Surgeon: Evans Lance, MD;  Location: [facility] CATH LAB;  Service: Cardiovascular;  Laterality: N/A;     Family History  Problem Relation Age of Onset  . Colon cancer Mother        Late 45's to early 29's  . Colon polyps Mother   . Diabetes Mother   . Heart disease Mother   . Other Mother        Irritable Bowel Syndrome  . Diabetes Father   . Heart disease Father      Social History  Socioeconomic History  . Marital status: Married    Spouse name: Not on file  . Number of children: Not on file  . Years of education: Not on file  . Highest education level: Not on file  Occupational History  . Not on file  Tobacco Use  . Smoking status: Never Smoker  . Smokeless tobacco: Never Used  Substance and Sexual Activity  . Alcohol use: Yes    Comment: occasional 1 glass of wine 1-2 tims a month  . Drug use: No  . Sexual activity: Yes    Birth control/protection: None  Other Topics Concern  . Not on file  Social History Narrative  . Not on file   Social Determinants of Health   Financial Resource Strain: Not on file  Food Insecurity: Not on file  Transportation Needs: Not on file  Physical Activity: Not on file  Stress: Not on file  Social Connections: Not on file  Intimate Partner  Violence: Not on file     BP 126/72   Pulse 66   Ht 5\' 4"  (1.626 m)   Wt 186 lb 9.6 oz (84.6 kg)   SpO2 97%   BMI 32.03 kg/m   Physical Exam:  Well appearing NAD HEENT: Unremarkable Neck:  No JVD, no thyromegally Lymphatics:  No adenopathy Back:  No CVA tenderness Lungs:  Clear with no wheezes HEART:  Regular rate  rhythm, no murmurs, no rubs, no clicks Abd:  soft, positive bowel sounds, no organomegally, no rebound, no guarding Ext:  2 plus pulses, no edema, no cyanosis, no clubbing Skin:  No rashes no nodules Neuro:  CN II through XII intact, motor grossly intact  EKG - nsr   Assess/Plan: 1. SVT - she will continue her low dose flecainide. We discussed taking additional flecainide. She will take additional doses as needed. 2. HTN - her bp is controlled. She is encouraged to lose weight and avoid salty food.   Brandy Overlie Rosenda Geffrard,MD

## 2020-12-03 NOTE — Patient Instructions (Signed)

## 2020-12-10 ENCOUNTER — Telehealth: Payer: Self-pay | Admitting: Internal Medicine

## 2020-12-10 ENCOUNTER — Other Ambulatory Visit: Payer: Self-pay | Admitting: Internal Medicine

## 2020-12-10 MED ORDER — FLECAINIDE ACETATE 50 MG PO TABS: 50.0000 mg | ORAL_TABLET | Freq: Two times a day (BID) | ORAL | 3 refills | Status: DC

## 2020-12-10 NOTE — Telephone Encounter (Signed)
*  STAT* If patient is at the pharmacy, call can be transferred to refill team.   1. Which medications need to be refilled? (please list name of each medication and dose if known)  flecainide (TAMBOCOR) 50 MG tablet  2. Which pharmacy/location (including street and city if local pharmacy) is medication to be sent to? [facility] [place]  3. Do they need a 30 day or 90 day supply? 90 day

## 2020-12-10 NOTE — Telephone Encounter (Signed)
Pt's medication was sent to pt's pharmacy as requested. Confirmation received.  °

## 2020-12-21 ENCOUNTER — Other Ambulatory Visit: Payer: Self-pay | Admitting: Family Medicine

## 2020-12-21 DIAGNOSIS — E2839 Other primary ovarian failure: Secondary | ICD-10-CM

## 2021-05-09 ENCOUNTER — Other Ambulatory Visit: Payer: Self-pay | Admitting: Surgery

## 2021-05-09 ENCOUNTER — Other Ambulatory Visit: Payer: Self-pay | Admitting: Family Medicine

## 2021-05-09 DIAGNOSIS — Z1231 Encounter for screening mammogram for malignant neoplasm of breast: Secondary | ICD-10-CM

## 2021-05-22 ENCOUNTER — Ambulatory Visit
Admission: RE | Admit: 2021-05-22 | Discharge: 2021-05-22 | Disposition: A | Discharge status: Home / Self Care | Payer: Medicare PPO | Source: Ambulatory Visit | Attending: Family Medicine | Admitting: Family Medicine

## 2021-05-22 ENCOUNTER — Other Ambulatory Visit: Payer: Self-pay

## 2021-05-22 DIAGNOSIS — E2839 Other primary ovarian failure: Secondary | ICD-10-CM

## 2021-07-03 ENCOUNTER — Other Ambulatory Visit: Payer: Self-pay

## 2021-07-03 ENCOUNTER — Ambulatory Visit
Admission: RE | Admit: 2021-07-03 | Discharge: 2021-07-03 | Disposition: A | Discharge status: Home / Self Care | Payer: Medicare PPO | Source: Ambulatory Visit | Attending: Family Medicine | Admitting: Family Medicine

## 2021-07-03 DIAGNOSIS — Z1231 Encounter for screening mammogram for malignant neoplasm of breast: Secondary | ICD-10-CM

## 2021-12-04 NOTE — Progress Notes (Signed)
Cardiology Office Note Date:  12/05/2021  Patient ID:  Arlyss, Weathersby [birth year], MRN [id number] PCP:  Leonard Downing, MD  Electrophysiologist: Dr. Lovena Le    Chief Complaint:  annual visit  History of Present Illness: Trisa Cranor is a 76 y.o. female with history of SVT  She comes in today to be seen for Dr. Lovena Le, last seen by him Jan 2022, at that time with rare palpitations on low dose flecainide, no changes were made, discussed prn addition flecainide pill for sustained palpitations.  TODAY She is doing very well In 2022 she had 3 episodes of tachycardia, about every 3 mo or so, will take a second flecainide and if continues another dose , and usually after the first, always by the second in about an hour or so. 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 No exertional intolerances No near syncope or syncope No other CP No SOB   Past Medical History:  Diagnosis Date   Allergy    Clotting disorder ([place])    Complication of anesthesia    once with colonoscopy   DIAPHORESIS 08/09/2010   Qualifier: Diagnosis of  By: Caryl Comes, MD, Remus Blake    GERD (gastroesophageal reflux disease)    Headache(784.0)    HEMORRHOIDS, INTERNAL 06/21/2008   Qualifier: Diagnosis of  By: Harlon Ditty CMA (AAMA), Dottie     Hx of blood clots    Hypercoagulable state ([place]) 09/16/2012   HYPERLIPIDEMIA 05/10/2008   Qualifier: Diagnosis of  By: Julaine Hua CMA (AAMA), Amanda     IBS (irritable bowel syndrome)    remote history   Pain in joint of right hip 05/06/2018   Pain of left hip joint 05/06/2018   Palpitations 08/09/2010   Qualifier: Diagnosis of  By: Caryl Comes, MD, Remus Blake    PONV (postoperative nausea and vomiting)    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    Qualifier: Diagnosis of  By: Hollie Salk CMA, Amanda     Thrombosis of arm     Past Surgical History:  Procedure Laterality Date   BREAST EXCISIONAL BIOPSY Left 1976   benign   cyst left breast  1976   CYSTECTOMY  1977   Left Breast    SUPRAVENTRICULAR TACHYCARDIA ABLATION  09/29/2012   unsuccessful   SUPRAVENTRICULAR TACHYCARDIA ABLATION N/A 09/29/2012   Procedure: SUPRAVENTRICULAR TACHYCARDIA ABLATION;  Surgeon: Evans Lance, MD;  Location: [facility] CATH LAB;  Service: Cardiovascular;  Laterality: N/A;    Current Outpatient Medications  Medication Sig Dispense Refill   aspirin 81 MG tablet Take 81 mg by mouth daily.     b complex vitamins tablet Take 1 tablet by mouth daily.     Calcium Carb-Cholecalciferol (CALCIUM 1000 + D PO) Take 2 tablets by mouth daily.     flecainide (TAMBOCOR) 50 MG tablet Take 1 tablet (50 mg total) by mouth 2 (two) times daily. 180 tablet 3   glucosamine-chondroitin 500-400 MG tablet Take 1 tablet by mouth daily.      Multiple Vitamin (MULTIVITAMIN) tablet Take 1 tablet by mouth daily.     rosuvastatin (CRESTOR) 5 MG tablet Take 5 mg by mouth daily.     VITAMIN D PO Take 1,000 Units by mouth daily.     No  current facility-administered medications for this visit.    Allergies:   Codeine, Estrogens, and Sulfonamide derivatives   Social History:  The patient  reports that she has never smoked. She has never used smokeless tobacco. She reports current alcohol use. She reports that she does not use drugs.   Family History:  The patient's family history includes Colon cancer in her mother; Colon polyps in her mother; Diabetes in her father and mother; Heart disease in her father and mother; Other in her mother.  ROS:  Please see the history of present illness.    All other systems are reviewed and otherwise negative.   PHYSICAL EXAM:  VS:  BP 122/72 (BP Location: Left Arm, Patient Position: Standing, Cuff Size: Normal)    Pulse 71    Ht 5\' 4"  (1.626 m)    Wt  172 lb (78 kg)    SpO2 93%    BMI 29.52 kg/m  BMI: Body mass index is 29.52 kg/m. Well nourished, well developed, in no acute distress HEENT: normocephalic, atraumatic Neck: no JVD, carotid bruits or masses Cardiac:  RRR; no significant murmurs, no rubs, or gallops Lungs:  CTA b/l, no wheezing, rhonchi or rales Abd: soft, nontender MS: no deformity or atrophy Ext: no edema Skin: warm and dry, no rash Neuro:  No gross deficits appreciated Psych: euthymic mood, full affect    EKG:  Done today and reviewed by myself shows  SR 71bpm, stable intervals   07/30/2016: stress myoview The patient walked for 7:30 of a standard [place] protocol. She achieved a peak HR of 133 which is 87% maximal predicted HR. There were no ST or T wave changes to suggest ischemia. There was no QRS widening in response to exercise ( patient is on Flecainide ) Negative GXT .  Recent Labs: No results found for requested labs within last 8760 hours.  No results found for requested labs within last 8760 hours.   CrCl cannot be calculated (Patient's most recent lab result is older than the maximum 21 days allowed.).   Wt Readings from Last 3 Encounters:  12/05/21 172 lb (78 kg)  12/03/20 186 lb 9.6 oz (84.6 kg)  11/17/19 192 lb (87.1 kg)     Other studies reviewed: Additional studies/records reviewed today include: summarized above  ASSESSMENT AND PLAN:  SVT On flecainide minimal burden of palpitations, that she finds very tolerable She takes flecainide 50mg  daily, with an additional dose or 2 only for episodes of her SVT    Disposition: F/u with [place] annually, sooner if needed  Current medicines are reviewed at length with the patient today.  The patient did not have any concerns regarding medicines.  Venetia Night, PA-C 12/05/2021 5:11 PM     [address] [place] [id number] [phone] (office)  [phone] (fax)

## 2021-12-05 ENCOUNTER — Other Ambulatory Visit: Payer: Self-pay

## 2021-12-05 ENCOUNTER — Ambulatory Visit: Payer: Medicare Other | Admitting: Physician Assistant

## 2021-12-05 ENCOUNTER — Encounter: Payer: Self-pay | Admitting: Physician Assistant

## 2021-12-05 VITALS — BP 122/72 | HR 71 | Ht 64.0 in | Wt 172.0 lb

## 2021-12-05 DIAGNOSIS — Z79899 Other long term (current) drug therapy: Secondary | ICD-10-CM | POA: Diagnosis not present

## 2021-12-05 DIAGNOSIS — I471 Supraventricular tachycardia: Secondary | ICD-10-CM | POA: Diagnosis not present

## 2021-12-05 DIAGNOSIS — Z5181 Encounter for therapeutic drug level monitoring: Secondary | ICD-10-CM

## 2021-12-05 NOTE — Patient Instructions (Addendum)

## 2022-03-15 ENCOUNTER — Other Ambulatory Visit: Payer: Self-pay | Admitting: Internal Medicine

## 2022-06-02 ENCOUNTER — Other Ambulatory Visit: Payer: Self-pay | Admitting: Family Medicine

## 2022-06-02 DIAGNOSIS — Z1231 Encounter for screening mammogram for malignant neoplasm of breast: Secondary | ICD-10-CM

## 2022-07-07 ENCOUNTER — Ambulatory Visit
Admission: RE | Admit: 2022-07-07 | Discharge: 2022-07-07 | Disposition: A | Discharge status: Home / Self Care | Payer: Medicare Other | Source: Ambulatory Visit | Attending: Family Medicine | Admitting: Family Medicine

## 2022-07-07 DIAGNOSIS — Z1231 Encounter for screening mammogram for malignant neoplasm of breast: Secondary | ICD-10-CM

## 2022-12-20 NOTE — Progress Notes (Unsigned)
Cardiology Office Note Date:  12/22/2022  Patient ID:  [name], [name] 1945/12/22, MRN [id number] PCP:  Leonard Downing, MD  Cardiologist:  None Electrophysiologist: Cristopher Peru, MD  Chief Complaint: 1 year follow-up  History of Present Illness: [name] is a 77 y.o. female with PMH notable for SVT, HLD; seen today for Cristopher Peru, MD for routine electrophysiology followup. Since last being seen in our clinic the patient reports doing well. 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 She has been offered repeat ablation, but has preferred medical mgmt. Initially on nadolol + flecainide, but reduced to flecainide only in 2018. She last saw PA Charlcie Cradle 11/2021, had rare tachycardia episodes (about 3/year), taking low dose flecainide daily + PRN doses during tachy episode. Also having brief episodes of CP, not exertional related, no SOB. Very active on farm  Today, she continues to have tachy episodes about 4 in last year. Takes daily flecainide + PRN during episodes. Takes another flecainide if episode persists after 45 minutes. All 4 episodes in last year have needed 2 doses of flecainide to break. She also does vagal maneuvers  during episodes. Overall happy with level of control.  She is also having episodes of chest tightness/pressure about every other month. During episodes also has lightheadedness and some dizziness. Has not found a precipitating event to episodes. Not related to exertion, stress, or activity.    Past Medical History:  Diagnosis Date   Allergy    Clotting disorder ([place])    Complication of anesthesia    once with colonoscopy   DIAPHORESIS 08/09/2010   Qualifier: Diagnosis of  By: Caryl Comes, MD, Remus Blake    GERD (gastroesophageal reflux disease)    Headache(784.0)    HEMORRHOIDS, INTERNAL 06/21/2008   Qualifier: Diagnosis of  By: Harlon Ditty CMA (AAMA), Dottie     Hx of blood clots     Hypercoagulable state ([place]) 09/16/2012   HYPERLIPIDEMIA 05/10/2008   Qualifier: Diagnosis of  By: Julaine Hua CMA (AAMA), Amanda     IBS (irritable bowel syndrome)    remote history   Pain in joint of right hip 05/06/2018   Pain of left hip joint 05/06/2018   Palpitations 08/09/2010   Qualifier: Diagnosis of  By: Caryl Comes, MD, Remus Blake    PONV (postoperative nausea and vomiting)    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   Qualifier: Diagnosis of  By: Hollie Salk CMA, Amanda     Thrombosis of arm     Past Surgical History:  Procedure Laterality Date   BREAST EXCISIONAL BIOPSY Left 1976   benign   cyst left breast  1976   CYSTECTOMY  1977   Left Breast    SUPRAVENTRICULAR TACHYCARDIA ABLATION  09/29/2012   unsuccessful   SUPRAVENTRICULAR TACHYCARDIA ABLATION N/A 09/29/2012   Procedure: SUPRAVENTRICULAR TACHYCARDIA ABLATION;  Surgeon: Evans Lance, MD;  Location: [facility] CATH LAB;  Service: Cardiovascular;  Laterality: N/A;    Current Outpatient Medications  Medication Instructions   aspirin 81 mg, Daily   b complex vitamins tablet 1 tablet, Oral, Daily   Calcium Carb-Cholecalciferol (CALCIUM 1000 + D PO) 1 tablet, Oral, Daily   flecainide (TAMBOCOR) 50 MG tablet Take 1 tablet (50 mg total) by mouth daily. May also take 1 tablet (50 mg total) as needed (tachycardia, repeat if needed).   glucosamine-chondroitin 500-400 MG tablet 1 tablet, Oral, Daily   metoprolol tartrate (LOPRESSOR) 100 mg, Oral,  Once, 2 HOURS PRIOR TO  SCHEDULED PROCEDURE   Multiple Vitamin (MULTIVITAMIN) tablet 1 tablet, Daily   rosuvastatin (CRESTOR) 5 mg, Oral, Daily, Per patient taking every other day   VITAMIN D PO 1,000 Units, Oral, Daily      Social History:  The patient  reports that she has never smoked. She has never used smokeless tobacco. She reports current alcohol use. She reports that she does not use drugs.   Family History:  The patient's family history includes Colon cancer in her mother;  Colon polyps in her mother; Diabetes in her father and mother; Heart disease in her father and mother; Other in her mother.  ROS:  Please see the history of present illness. All other systems are reviewed and otherwise negative.   PHYSICAL EXAM:  VS:  BP 114/78   Pulse 64   Ht 5' 3.75" (1.619 m)   Wt 174 lb 9.6 oz (79.2 kg)   SpO2 97%   BMI 30.21 kg/m  BMI: Body mass index is 30.21 kg/m.  GEN- The patient is well appearing, alert and oriented x 3 today.   HEENT: normocephalic, atraumatic; sclera clear, conjunctiva pink; hearing intact; oropharynx clear; neck supple, no JVP Lungs- Clear to ausculation bilaterally, normal work of breathing.  No wheezes, rales, rhonchi Heart- Regular rate and rhythm, no murmurs, rubs or gallops, PMI not laterally displaced GI- soft, non-tender, non-distended, bowel sounds present, no hepatosplenomegaly Extremities- No peripheral edema. no clubbing or cyanosis; DP/PT/radial pulses 2+ bilaterally MS- no significant deformity or atrophy Skin- warm and dry, no rash or lesion,  Psych- euthymic mood, full affect Neuro- strength and sensation are intact   EKG is ordered. Personal review of EKG from today shows:  NSR, rate 64bpm.   Recent Labs: No results found for requested labs within last 365 days.  No results found for requested labs within last 365 days.   CrCl cannot be calculated (Patient's most recent lab result is older than the maximum 21 days allowed.).   Wt Readings from Last 3 Encounters:  12/22/22 174 lb 9.6 oz (79.2 kg)  12/05/21 172 lb (78 kg)  12/03/20 186 lb 9.6 oz (84.6 kg)     Additional studies reviewed include: Previous EP, cardiology notes.     ASSESSMENT AND PLAN:  #) SVT  S/p initially successful AVNRT ablation with recurrance within a year of procedure On flecainide only (no BB) + PRN with episodes She is happy with level of control Intervals on EKG good  #) Chest Pressure #) HLD Happening more often than  previously Also having dizziness/lightheadedness with CP Cardiac CT to eval cors   Current medicines are reviewed at length with the patient today.   The patient does not have concerns regarding her medicines.  The following changes were made today:  none  Labs/ tests ordered today include:  Orders Placed This Encounter  Procedures   CT CORONARY MORPH W/CTA COR W/SCORE W/CA W/CM &/OR WO/CM   Basic Metabolic Panel (BMET)   EKG 12-Lead     Disposition: Follow up with Dr. Lovena Le in in 12 months, sooner depending on CT results   Signed, Mamie Levers, NP  12/22/22  10:11 AM  Electrophysiology [facility]

## 2022-12-22 ENCOUNTER — Ambulatory Visit: Payer: Medicare Other | Attending: Physician Assistant | Admitting: Cardiology

## 2022-12-22 ENCOUNTER — Encounter: Payer: Self-pay | Admitting: Physician Assistant

## 2022-12-22 VITALS — BP 114/78 | HR 64 | Ht 63.75 in | Wt 174.6 lb

## 2022-12-22 DIAGNOSIS — I471 Supraventricular tachycardia, unspecified: Secondary | ICD-10-CM

## 2022-12-22 DIAGNOSIS — E785 Hyperlipidemia, unspecified: Secondary | ICD-10-CM

## 2022-12-22 DIAGNOSIS — R079 Chest pain, unspecified: Secondary | ICD-10-CM | POA: Diagnosis not present

## 2022-12-22 DIAGNOSIS — R0789 Other chest pain: Secondary | ICD-10-CM | POA: Diagnosis not present

## 2022-12-22 LAB — BASIC METABOLIC PANEL WITH GFR
BUN/Creatinine Ratio: 15 (ref 12–28)
BUN: 13 mg/dL (ref 8–27)
CO2: 26 mmol/L (ref 20–29)
Calcium: 10.1 mg/dL (ref 8.7–10.3)
Chloride: 101 mmol/L (ref 96–106)
Creatinine, Ser: 0.85 mg/dL (ref 0.57–1.00)
Glucose: 100 mg/dL — ABNORMAL HIGH (ref 70–99)
Potassium: 4.6 mmol/L (ref 3.5–5.2)
Sodium: 142 mmol/L (ref 134–144)
eGFR: 72 mL/min/1.73

## 2022-12-22 MED ORDER — METOPROLOL TARTRATE 100 MG PO TABS: 100.0000 mg | ORAL_TABLET | Freq: Once | ORAL | 0 refills | Status: DC

## 2022-12-22 MED ORDER — FLECAINIDE ACETATE 50 MG PO TABS: ORAL_TABLET | ORAL | 2 refills | Status: DC

## 2022-12-22 NOTE — Patient Instructions (Addendum)
Medication Instructions:   Your physician recommends that you continue on your current medications as directed. Please refer to the Current Medication list given to you today.   (TAKE YOUR METOPROLOL 100 MG ONE DOSE  ONE TABLET TWO HOURS PRIOR TO  SCHEDULED      PROCEDURE )  *If you need a refill on your cardiac medications before your next appointment, please call your pharmacy*   Lab Work:  BMET TODAY    If you have labs (blood work) drawn today and your tests are completely normal, you will receive your results only by: [place] (if you have MyChart) OR A paper copy in the mail If you have any lab test that is abnormal or we need to change your treatment, we will call you to review the results.  Testing:  Non-Cardiac CT Angiography (CTA), is a special type of CT scan that uses a computer to produce multi-dimensional views of major blood vessels throughout the body. In CT angiography, a contrast material is injected through an IV to help visualize the blood vessels   Follow-Up: At [facility], you and your health needs are our priority.  As part of our continuing mission to provide you with exceptional heart care, we have created designated Provider Care Teams.  These Care Teams include your primary Cardiologist (physician) and Advanced Practice Providers (APPs -  Physician Assistants and Nurse Practitioners) who all work together to provide you with the care you need, when you need it.  We recommend signing up for the patient portal called "MyChart".  Sign up information is provided on this After Visit Summary.  MyChart is used to connect with patients for Virtual Visits (Telemedicine).  Patients are able to view lab/test results, encounter notes, upcoming appointments, etc.  Non-urgent messages can be sent to your provider as well.   To learn more about what you can do with MyChart, go to [email or web address].    Your next appointment:   1 year(s)  Provider:    You may see Cristopher Peru, MD or one of the following Advanced Practice Providers on your designated Care Team:   Tommye Standard, [place] Legrand Como "Jonni Sanger" [place], [place] Mamie Levers, NP  Other Instructions    Your cardiac CT will be scheduled at one of the below locations:   [facility] [address] [place], [place] [address] [phone]  [facility] [address] [place], [place] [address] [phone]  [facility] [place], [place] [address] [phone]  If scheduled at [facility], please arrive at the [facility] and Children's Entrance (Entrance C2) of [facility] 30 minutes prior to test start time. You can use the FREE valet parking offered at entrance C (encouraged to control the heart rate for the test)  Proceed to the [facility] Radiology Department (first floor) to check-in and test prep.  All radiology patients and guests should use entrance C2 at [facility], accessed from [facility], even though the hospital's physical address listed is [address]    If scheduled at [facility] or [facility], please arrive 15 mins early for check-in and test prep.   Please follow these instructions carefully (unless otherwise directed):   On the Night Before the Test: Be sure to Drink plenty of water. Do not consume any caffeinated/decaffeinated beverages or chocolate 12 hours prior to your  test. Do not take any antihistamines 12 hours prior to your test.  On the Day of the Test: Drink plenty of water until 1 hour prior to the test. Do not eat any food 1 hour prior to test. You may take your regular medications prior to the test.  Take metoprolol (Lopressor) two hours prior to test. If you take Furosemide/Hydrochlorothiazide/Spironolactone, please HOLD on the morning of  the test. FEMALES- please wear underwire-free bra if available, avoid dresses & tight clothing       After the Test: Drink plenty of water. After receiving IV contrast, you may experience a mild flushed feeling. This is normal. On occasion, you may experience a mild rash up to 24 hours after the test. This is not dangerous. If this occurs, you can take Benadryl 25 mg and increase your fluid intake. If you experience trouble breathing, this can be serious. If it is severe call 911 IMMEDIATELY. If it is mild, please call our office. If you take any of these medications: Glipizide/Metformin, Avandament, Glucavance, please do not take 48 hours after completing test unless otherwise instructed.  We will call to schedule your test 2-4 weeks out understanding that some insurance companies will need an authorization prior to the service being performed.   For non-scheduling related questions, please contact the cardiac imaging nurse navigator should you have any questions/concerns: Marchia Bond, Cardiac Imaging Nurse Navigator Gordy Clement, Cardiac Imaging Nurse Navigator  Heart and Vascular Services Direct Office Dial: [phone]   For scheduling needs, including cancellations and rescheduling, please call [place], [phone].

## 2022-12-30 ENCOUNTER — Telehealth (HOSPITAL_COMMUNITY): Payer: Self-pay | Admitting: *Deleted

## 2022-12-30 NOTE — Telephone Encounter (Signed)
Reaching out to patient to offer assistance regarding upcoming cardiac imaging study; pt verbalizes understanding of appt date/time, parking situation and where to check in, pre-test NPO status and medications ordered, and verified current allergies; name and call back number provided for further questions should they arise  Gordy Clement RN Navigator Cardiac [place] [facility] [phone] office [phone] cell  Patient to take 46m metoprolol tartrate if HR is greater than 65bpm TWO hours prior to her cardiac CT scan. She is aware to arrive at 8am.

## 2022-12-30 NOTE — Telephone Encounter (Signed)
Attempted to call patient canceling her upcoming cardiac CT appointment. Left message on voicemail with name and callback number  Gordy Clement RN Navigator Cardiac [place] [facility] [phone] Office [phone] Cell

## 2022-12-31 ENCOUNTER — Ambulatory Visit (HOSPITAL_COMMUNITY)
Admission: RE | Admit: 2022-12-31 | Discharge: 2022-12-31 | Disposition: A | Discharge status: Home / Self Care | Payer: Medicare Other | Source: Ambulatory Visit | Attending: Cardiology | Admitting: Cardiology

## 2022-12-31 ENCOUNTER — Ambulatory Visit (HOSPITAL_COMMUNITY): Admission: RE | Admit: 2022-12-31 | Payer: Medicare Other | Source: Ambulatory Visit

## 2022-12-31 DIAGNOSIS — R079 Chest pain, unspecified: Secondary | ICD-10-CM | POA: Diagnosis not present

## 2022-12-31 DIAGNOSIS — I2510 Atherosclerotic heart disease of native coronary artery without angina pectoris: Secondary | ICD-10-CM | POA: Diagnosis not present

## 2022-12-31 MED ORDER — IOHEXOL 350 MG/ML SOLN
100.0000 mL | Freq: Once | INTRAVENOUS | Status: AC | PRN
  Administered 2022-12-31: 100 mL via INTRAVENOUS

## 2022-12-31 MED ORDER — NITROGLYCERIN 0.4 MG SL SUBL
0.8000 mg | SUBLINGUAL_TABLET | Freq: Once | SUBLINGUAL | Status: AC
  Administered 2022-12-31: 0.8 mg via SUBLINGUAL

## 2022-12-31 MED ORDER — NITROGLYCERIN 0.4 MG SL SUBL
SUBLINGUAL_TABLET | SUBLINGUAL | Status: AC
  Filled 2022-12-31: qty 2

## 2023-01-05 ENCOUNTER — Telehealth: Payer: Self-pay | Admitting: Cardiology

## 2023-01-05 DIAGNOSIS — I471 Supraventricular tachycardia, unspecified: Secondary | ICD-10-CM

## 2023-01-05 NOTE — Telephone Encounter (Signed)
Brandy Levers, NP 12/31/2022  5:55 PM EST     Please call patient with results - no significant blockages to explain her chest pain. We can continue to eval her heart by getting an echo - she hasn't had one in about 10 years. But I also want her to have appt with PCP to eval non-cardiac causes (like gerd, anxiety, etc.)   The patient has been notified of the result and verbalized understanding.  All questions (if any) were answered. Bernestine Amass, RN 01/05/2023 8:19 AM

## 2023-01-05 NOTE — Telephone Encounter (Signed)
Pt calling in for CT results

## 2023-01-06 NOTE — Telephone Encounter (Signed)
Patient wants a call back to discuss details of test and any medication changes if needed.

## 2023-01-06 NOTE — Telephone Encounter (Signed)
Advised pt of Echocardiogram scheduled for 01/08/23 and what test evaluates.    Pt reports is concerned that Calcium Score is 209.  Wants to know if medication rosuvastatin needs to be adjusted.  Currently taking 5 mg PO QD.  Per pt has been on this med for many years.  Pt also concerned about the following finding from Cardiac CT: FINDINGS: Scout view is unremarkable. 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  Will send to provider to address.

## 2023-01-06 NOTE — Telephone Encounter (Signed)
Called pt advised of Providers response:   "Riddle, Nunzio Cobbs, NP  P Cv Div Ch St Triage Caller: Unspecified (Yesterday,  8:15 AM) 1 - the calcium score is used to determine whether to start a patient on a statin, not necessarily whether the dose needs to be adjusted. She should follow-up with PCP or whoever is managing her HLD. I can't see any lipid labs in epic. Or, if she prefers to have one of our gen cards folks manage her HLD, I'm happy to refer to them. 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  2 - the pleuroparenchymal scarring is an incidental finding. If she has SOB or other lung/breathing issues, can repeat scan to make sure it's not worsening. Also something PCP can keep an eye on.   If she has further questions, I'm happy to call her to discuss everything, just let me know.  Suzann"  Pt expresses understanding.  Will have cholesterol checked with PCP.  Denies SOB or difficulty breathing.  Advised pt will send results to PCP to f/u. Sent via Epic routing.

## 2023-01-08 ENCOUNTER — Ambulatory Visit (HOSPITAL_COMMUNITY): Payer: Medicare Other | Attending: Cardiology

## 2023-01-08 DIAGNOSIS — I471 Supraventricular tachycardia, unspecified: Secondary | ICD-10-CM | POA: Diagnosis present

## 2023-01-08 DIAGNOSIS — E785 Hyperlipidemia, unspecified: Secondary | ICD-10-CM | POA: Insufficient documentation

## 2023-01-08 DIAGNOSIS — I517 Cardiomegaly: Secondary | ICD-10-CM | POA: Insufficient documentation

## 2023-01-08 DIAGNOSIS — R079 Chest pain, unspecified: Secondary | ICD-10-CM | POA: Insufficient documentation

## 2023-01-09 LAB — ECHOCARDIOGRAM COMPLETE
Area-P 1/2: 3.63 cm2
S' Lateral: 2.7 cm

## 2023-01-14 ENCOUNTER — Telehealth: Payer: Self-pay | Admitting: Internal Medicine

## 2023-01-14 NOTE — Telephone Encounter (Signed)
Pt called [facility] triage back.  Pt made aware of Coronary CTA and Echocardiogram test results per Ms. Mamie Levers NP.  Pt appreciated the call.  Pt is currently taking Flecainide 50 mg daily.  Pt wanted to know if she needs to take the Flecainide differently?  Pt states that she was having some morning dizziness while taking Flecainide, but this symptom has been intermittent.  I advised Pt that I will reach out to Ms. Suzann NP or Dr. Lovena Le, and make them aware of her question.    Pt advised per Ms. Nunzio Cobbs NP to obtain a PCP appointment to evaluate non-cardiac causes to symptoms... Gerd, Anxiety, etc.  Pt understood and stated she will.  Will forward message to Ms. Suzann NP to address Flecainide question.

## 2023-01-14 NOTE — Telephone Encounter (Signed)
Patient calling to see if she needs to change any of the medication. Please advise

## 2023-01-14 NOTE — Telephone Encounter (Signed)
Called Pt back per call received in [place] Triage.    Left message for Pt to call me back.    Next step in Ms. Brandy Cobbs NP care plan if for patient to see her PCP Dr. Arelia Sneddon. No PCP appointments seen in Epic.

## 2023-01-15 NOTE — Telephone Encounter (Signed)
Question received from Ms. Jettie Booze, NP:  How often is the dizziness happening? Are these new? I don't remember her mentioning them to me during our appointment.  Is she able to check her pulse during dizzy episodes? Flecainide can lower HR, and so if her pulse is low during dizzy episodes, it could be the flecainide.   Pt called back and asked questions above.    Pt states the dizziness does not happen all the time.  It occurs when she bends over for extended periods of time gardening, or gets to [place] working outside.  With quick / position changes that are labor related.  She states when she feels the dizziness her HR is between 65-70 bpm.  In the morning when she wakes, her HR is 54-57 bpm.  Pt stressed the symptom is not constant. I will communicate this to Ms. Suzann NP, for follow up.

## 2023-03-18 ENCOUNTER — Other Ambulatory Visit: Payer: Self-pay | Admitting: Internal Medicine

## 2023-03-18 MED ORDER — FLECAINIDE ACETATE 50 MG PO TABS: ORAL_TABLET | ORAL | 3 refills | Status: DC

## 2023-08-18 ENCOUNTER — Other Ambulatory Visit: Payer: Self-pay | Admitting: Family Medicine

## 2023-08-18 DIAGNOSIS — Z1231 Encounter for screening mammogram for malignant neoplasm of breast: Secondary | ICD-10-CM

## 2023-09-15 ENCOUNTER — Ambulatory Visit: Payer: Medicare Other

## 2023-09-17 ENCOUNTER — Ambulatory Visit
Admission: RE | Admit: 2023-09-17 | Discharge: 2023-09-17 | Disposition: A | Discharge status: Home / Self Care | Payer: Medicare Other | Source: Ambulatory Visit | Attending: Family Medicine | Admitting: Family Medicine

## 2023-09-17 DIAGNOSIS — Z1231 Encounter for screening mammogram for malignant neoplasm of breast: Secondary | ICD-10-CM

## 2023-12-22 ENCOUNTER — Ambulatory Visit: Payer: Medicare Other | Admitting: Student

## 2023-12-23 ENCOUNTER — Ambulatory Visit: Payer: Medicare Other | Admitting: Student

## 2023-12-30 ENCOUNTER — Ambulatory Visit: Payer: Medicare Other | Admitting: Physician Assistant

## 2023-12-31 NOTE — Progress Notes (Unsigned)
  Electrophysiology Office Note:   Date:  01/01/2024  ID:  Janat Tabbert, DOB July 18, 1946, MRN [id number]  Primary Cardiologist: None Electrophysiologist: Lewayne Bunting, MD      History of Present Illness:   [name] is a 78 y.o. female with h/o SVT and HLD seen today for routine electrophysiology followup.   Since last being seen in our clinic the patient reports doing well overall. She is taking very low dose flecainide daily. Has tachycardia sensation about 4 times a year. Does have some lightheadedness with rapid standing, but not severe or activity limiting currently. She denies exertional chest pain,  dyspnea, PND, orthopnea, nausea, vomiting, syncope, edema, weight gain, or early satiety.   Review of systems complete and found to be negative unless listed in HPI.   EP Information / Studies Reviewed:    EKG is ordered today. Personal review as below.  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  Maintained on flecainide   Physical Exam:   VS:  BP 130/80   Pulse 62   Ht 5' 3.75" (1.619 m)   Wt 173 lb (78.5 kg)   SpO2 95%   BMI 29.93 kg/m    Wt Readings from Last 3 Encounters:  01/01/24 173 lb (78.5 kg)  12/22/22 174 lb 9.6 oz (79.2 kg)  12/05/21 172 lb (78 kg)     GEN: No acute distress NECK: No JVD; No carotid bruits CARDIAC: Regular rate and rhythm, no murmurs, rubs, gallops RESPIRATORY:  Clear to auscultation without rales, wheezing or rhonchi  ABDOMEN: Soft, non-tender, non-distended EXTREMITIES:  No edema; No deformity    ASSESSMENT AND PLAN:    SVT S/p ablation 09/2012 Had recurrence post ablation but now stable on flecainide EKG today stable with normal intervals Continue low dose flecainide 50 mg daily with extra prn for arrhyhtmia.  Not on BB Discussed that if she has more symptoms, requires more flecainide, or has EKG changes would need to intensify her follow up to q 6 months. With very low dose, will continue annual follow up for now.   HLD Chest discomfort Calcium score 209 (74th percentile) - Mild non-obstructive CAD Echo 01/2023 showed LVEF 60-65%, grade 1 DD   Follow up with EP APP in 12 months, sooner with issues.   Signed, Graciella Freer, PA-C

## 2024-01-01 ENCOUNTER — Encounter: Payer: Self-pay | Admitting: Student

## 2024-01-01 ENCOUNTER — Ambulatory Visit: Payer: Medicare Other | Attending: Student | Admitting: Student

## 2024-01-01 VITALS — BP 130/80 | HR 62 | Ht 63.75 in | Wt 173.0 lb

## 2024-01-01 DIAGNOSIS — I471 Supraventricular tachycardia, unspecified: Secondary | ICD-10-CM

## 2024-01-01 DIAGNOSIS — E785 Hyperlipidemia, unspecified: Secondary | ICD-10-CM

## 2024-01-01 DIAGNOSIS — R079 Chest pain, unspecified: Secondary | ICD-10-CM

## 2024-01-01 NOTE — Patient Instructions (Signed)
Medication Instructions:  Your physician recommends that you continue on your current medications as directed. Please refer to the Current Medication list given to you today.  *If you need a refill on your cardiac medications before your next appointment, please call your pharmacy*  Lab Work: None ordered If you have labs (blood work) drawn today and your tests are completely normal, you will receive your results only by: MyChart Message (if you have MyChart) OR A paper copy in the mail If you have any lab test that is abnormal or we need to change your treatment, we will call you to review the results.  Follow-Up: At [facility], you and your health needs are our priority.  As part of our continuing mission to provide you with exceptional heart care, we have created designated Provider Care Teams.  These Care Teams include your primary Cardiologist (physician) and Advanced Practice Providers (APPs -  Physician Assistants and Nurse Practitioners) who all work together to provide you with the care you need, when you need it.  Your next appointment:   1 year(s)  Provider:   You will see one of the following Advanced Practice Providers on your designated Care Team:   Francis Dowse, Charlott Holler [address]" [place], [place] Sherie Don, NP Canary Brim, NP

## 2024-01-19 ENCOUNTER — Telehealth: Payer: Self-pay | Admitting: Internal Medicine

## 2024-01-19 NOTE — Telephone Encounter (Signed)
 Pt c/o of Chest Pain: STAT if active (IN THIS MOMENT) CP, including tightness, pressure, jaw pain, shoulder/upper arm/back pain, SOB, nausea, and vomiting.  1. Are you having CP right now (tightness, pressure, or discomfort)? No   2. Are you experiencing any other symptoms (ex. SOB, nausea, vomiting, sweating)? 150 HR for two hours 8 days ago, lethargic and SOB   3. How long have you been experiencing CP? Last experienced two days ago.   4. Is your CP continuous or coming and going? Coming and going   5. Have you taken Nitroglycerin? No   6. If CP returns before callback, please consider calling 911. ?

## 2024-01-19 NOTE — Telephone Encounter (Signed)
 Called pt to assess CP.  Reports had an episode of SVT about 8 days ago that was different from previous episodes.  Reports had CP that was in back also.  Also reports pulled a muscle in back about 4-5 days ago while doing yard work. Reports has a hx of clot in lungs wondering if may have a blood clot.  Advised pt to contact PCP for this matter.  Pt reports has already contacted PCP and was told to contact Dr. [name].    Pt not currently having CP O2 sat per pt 94-95% RA HR 70-80.  Advised will send concern to MD and RN to f/u.

## 2024-01-21 NOTE — Telephone Encounter (Signed)
 Followup with Renee as soon as schedule allows. If chest pain continues she will need to seek ER eval.

## 2024-01-26 NOTE — Telephone Encounter (Signed)
 Called pt advised of MD recommendation ED visit for CP.  Pt reports only had once during episode of SVT.  Pt expresses understanding no questions at this time.

## 2024-02-09 NOTE — Progress Notes (Unsigned)
 Cardiology Office Note:  .   Date:  02/09/2024  ID:  Romie Minus, DOB 26-Oct-1946, MRN [id number] PCP: Kaleen Mask, MD  [facility] Providers Cardiologist:  None Electrophysiologist:  Lewayne Bunting, MD {  History of Present Illness: .   Valina Maes is a 78 y.o. female w/PMHx of  HLD, GERD SVT  Saw Mardelle Matte 01/01/24, reported perhaps 4 episodes of palpitations a year, occ lightheaded with rapid standing Stable intervals on low dose flecainide, no BB on board Reported CP, discussed no obstructive CAD by her CT  Pt recently called with episode of SVT associated with CP, also mentioned a pulled muscle in her back when doing yard work. Reported hx of PE and wondered if she may have a blood clot  Today's visit is scheduled for evaluation of CP  ROS:   *** problem list does report hypercoag and arm thrombosis??? *** add BB? 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 Left ventricular ejection fraction, by estimation, is 60 to 65%. The  left ventricle has normal function. The left ventricle has no regional  wall motion abnormalities. There is mild left ventricular hypertrophy.  Left ventricular diastolic parameters  are consistent with Grade I diastolic dysfunction (impaired relaxation).   2. Right ventricular systolic function is normal. The right ventricular  size is normal. Tricuspid regurgitation signal is inadequate for assessing  PA pressure.   3. The mitral valve is grossly normal. Trivial mitral valve  regurgitation. No evidence of mitral stenosis.   4. The aortic valve is tricuspid. There is mild thickening of the aortic  valve. Aortic valve regurgitation is not visualized. No aortic stenosis is  present.   5. The inferior vena cava is normal in size with greater than 50%  respiratory variability, suggesting  right atrial pressure of 3 mmHg.     12/31/2022: coronary CT IMPRESSION: 1. Coronary calcium score of 209. This was 74th percentile for age, sex, and race matched control.   2. Normal coronary origin with right dominance.   3. CAD-RADS 2. Mild non-obstructive CAD (25-49%). Consider non-atherosclerotic causes of chest pain. Consider preventive therapy and risk factor modification.     07/30/2016: stress myoview The patient walked for 7:30 of a standard Bruce protocol. She achieved a peak HR of 133 which is 87% maximal predicted HR. There were no ST or T wave changes to suggest ischemia. There was no QRS widening in response to exercise ( patient is on Flecainide ) Negative GXT .     Risk Assessment/Calculations:    Physical Exam:   VS:  There were no vitals taken for this visit.   Wt Readings from Last 3 Encounters:  01/01/24 173 lb (78.5 kg)  12/22/22 174 lb 9.6 oz (79.2 kg)  12/05/21 172 lb (78 kg)    GEN: Well nourished, well developed in no acute distress NECK: No JVD; No carotid bruits CARDIAC: ***RRR, no murmurs, rubs, gallops RESPIRATORY:  *** CTA b/l without rales, wheezing or rhonchi  ABDOMEN: Soft, non-tender, non-distended EXTREMITIES: *** No edema; No deformity   ASSESSMENT AND PLAN: .    SVT ***  CP ***      {Are you ordering a CV Procedure (e.g. stress test, cath, DCCV, TEE, etc)?   Press F2        :[id number]}     Dispo: ***  Signed, Sheilah Pigeon, PA-C

## 2024-02-11 ENCOUNTER — Ambulatory Visit: Attending: Physician Assistant | Admitting: Physician Assistant

## 2024-02-11 ENCOUNTER — Encounter: Payer: Self-pay | Admitting: Physician Assistant

## 2024-02-11 VITALS — BP 140/78 | HR 67 | Ht 63.75 in | Wt 176.6 lb

## 2024-02-11 DIAGNOSIS — I471 Supraventricular tachycardia, unspecified: Secondary | ICD-10-CM

## 2024-02-11 DIAGNOSIS — R0600 Dyspnea, unspecified: Secondary | ICD-10-CM

## 2024-02-11 DIAGNOSIS — R079 Chest pain, unspecified: Secondary | ICD-10-CM | POA: Diagnosis not present

## 2024-02-11 MED ORDER — METOPROLOL SUCCINATE ER 25 MG PO TB24: 25.0000 mg | ORAL_TABLET | Freq: Every day | ORAL | 3 refills | Status: DC

## 2024-02-11 NOTE — Patient Instructions (Signed)
 Medication Instructions:   START TAKING:  METOPROLOL 25 MG ONCE A DAY   *If you need a refill on your cardiac medications before your next appointment, please call your pharmacy*  Lab Work: NONE ORDERED  TODAY    If you have labs (blood work) drawn today and your tests are completely normal, you will receive your results only by: MyChart Message (if you have MyChart) OR A paper copy in the mail If you have any lab test that is abnormal or we need to change your treatment, we will call you to review the results.  Testing/Procedures:Your physician has requested that you have an echocardiogram. Echocardiography is a painless test that uses sound waves to create images of your heart. It provides your doctor with information about the size and shape of your heart and how well your heart's chambers and valves are working. This procedure takes approximately one hour. There are no restrictions for this procedure. Please do NOT wear cologne, perfume, aftershave, or lotions (deodorant is allowed). Please arrive 15 minutes prior to your appointment time.  Please note: We ask at that you not bring children with you during ultrasound (echo/ vascular) testing. Due to room size and safety concerns, children are not allowed in the ultrasound rooms during exams. Our front office staff cannot provide observation of children in our lobby area while testing is being conducted. An adult accompanying a patient to their appointment will only be allowed in the ultrasound room at the discretion of the ultrasound technician under special circumstances. We apologize for any inconvenience.   Follow-Up: At [facility], you and your health needs are our priority.  As part of our continuing mission to provide you with exceptional heart care, our providers are all part of one team.  This team includes your primary Cardiologist (physician) and Advanced Practice Providers or APPs (Physician Assistants and Nurse  Practitioners) who all work together to provide you with the care you need, when you need it.  Your next appointment:    2 month(s)  ( CONTACT  CASSIE HALL/ ANGELINE HAMMER FOR EP SCHEDULING ISSUES )   Provider:    You may see Lewayne Bunting, MD or one of the following Advanced Practice Providers on your designated Care Team:   Francis Dowse, [place]   We recommend signing up for the patient portal called "MyChart".  Sign up information is provided on this After Visit Summary.  MyChart is used to connect with patients for Virtual Visits (Telemedicine).  Patients are able to view lab/test results, encounter notes, upcoming appointments, etc.  Non-urgent messages can be sent to your provider as well.   To learn more about what you can do with MyChart, go to [email or web address].    Other Instructions        1st Floor: - Lobby - Registration  - Pharmacy  - Lab - Cafe  2nd Floor: - PV Lab - Diagnostic Testing (echo, CT, nuclear med)  3rd Floor: - Vacant  4th Floor: - TCTS (cardiothoracic surgery) - AFib Clinic - Structural Heart Clinic - Vascular Surgery  - Vascular Ultrasound  5th Floor: - HeartCare Cardiology (general and EP) - Clinical Pharmacy for coumadin, hypertension, lipid, weight-loss medications, and med management appointments    Valet parking services will be available as well.

## 2024-03-16 ENCOUNTER — Ambulatory Visit (HOSPITAL_COMMUNITY): Attending: Physician Assistant

## 2024-03-16 DIAGNOSIS — R0600 Dyspnea, unspecified: Secondary | ICD-10-CM | POA: Diagnosis present

## 2024-03-16 LAB — ECHOCARDIOGRAM COMPLETE
Area-P 1/2: 2.66 cm2
Est EF: 60
S' Lateral: 2.9 cm

## 2024-08-03 ENCOUNTER — Telehealth: Payer: Self-pay | Admitting: Internal Medicine

## 2024-08-03 NOTE — Telephone Encounter (Signed)
 Pt called In and stated she had some SVT episodes last Friday and then 12 hours later on Saturday.  Patient stated she would like to talk to nurse about her meds or what she should do.    Best number [phone]

## 2024-08-03 NOTE — Telephone Encounter (Signed)
 Spoke to patient she stated PCP stopped Metoprolol  on 9/10 due to severe constipation.He started Diltiazem 120 mg daily.She had  2 episodes of fast heart beat this past Saturday.PCP advised her to call.Advised I will send message to Charlies Arthur for advice.

## 2024-08-09 ENCOUNTER — Other Ambulatory Visit: Payer: Self-pay

## 2024-08-09 ENCOUNTER — Telehealth: Payer: Self-pay

## 2024-08-09 MED ORDER — FLECAINIDE ACETATE 50 MG PO TABS
ORAL_TABLET | ORAL | 3 refills | Status: AC
Start: 2024-08-09 — End: ?

## 2024-08-09 NOTE — Telephone Encounter (Signed)
 Pt is having breakthrough SVT. Per Dr Waddell she may take Flecainide  twice daily and take an additional 1 tablet if continuing to have breakthrough. (Up to 3 tablets) No EKG at this time per Dr Waddell but pt is set for Appt on Nov 6th with Dr Waddell. Pt was called and she stated understanding.

## 2024-09-15 ENCOUNTER — Encounter: Payer: Self-pay | Admitting: Internal Medicine

## 2024-09-15 ENCOUNTER — Ambulatory Visit: Attending: Internal Medicine | Admitting: Internal Medicine

## 2024-09-15 VITALS — BP 114/70 | HR 62 | Ht 63.75 in | Wt 179.9 lb

## 2024-09-15 DIAGNOSIS — E785 Hyperlipidemia, unspecified: Secondary | ICD-10-CM | POA: Diagnosis not present

## 2024-09-15 DIAGNOSIS — I471 Supraventricular tachycardia, unspecified: Secondary | ICD-10-CM | POA: Diagnosis not present

## 2024-09-15 DIAGNOSIS — R079 Chest pain, unspecified: Secondary | ICD-10-CM

## 2024-09-15 DIAGNOSIS — R0600 Dyspnea, unspecified: Secondary | ICD-10-CM | POA: Diagnosis not present

## 2024-09-15 NOTE — Patient Instructions (Addendum)
 Medication Instructions:  Your physician recommends that you continue on your current medications as directed. Please refer to the Current Medication list given to you today.  *If you need a refill on your cardiac medications before your next appointment, please call your pharmacy*  Lab Work: None ordered.  You may go to any [facility] Location for your lab work:  [facility] - [address] ([facility]) - [address] [phone] [address]  [place] - [address]   [place]  - [address]   [place] - [address] - [address] [place]  - [address] ([facility]   If you have labs (blood work) drawn today and your tests are completely normal, you will receive your results only by: [employer] (if you have MyChart)  If you have any lab test that is abnormal or we need to change your treatment, we will call you or send a MyChart message to review the results.  Testing/Procedures: None ordered.  Follow-Up: At [facility], you and your health needs are our priority.  As part of our continuing mission to provide you with exceptional heart care, we have created designated Provider Care Teams.  These Care Teams include your primary Cardiologist (physician) and Advanced Practice Providers (APPs -  Physician Assistants and Nurse Practitioners) who all work together to provide you with the care you need, when you need it.  We recommend signing up for the patient portal called MyChart.  Sign up information is provided on this After Visit Summary.  MyChart is used to connect with patients for Virtual Visits (Telemedicine).  Patients are able to view lab/test results, encounter notes, upcoming appointments, etc.  Non-urgent messages can be sent to your provider as well.   To learn more about what you can do with MyChart, go to  [email or web address].    Your next appointment:   July or August 2026  The format for your next appointment:   In Person  Provider:   Donnice Primus, MD or one of the following Advanced Practice Providers on your designated Care Team:   Charlies Arthur, [place] Ozell Jodie Passey, [place] Leotis Barrack, NP  Note: Remote monitoring is used to monitor your Pacemaker/ ICD from home. This monitoring reduces the number of office visits required to check your device to one time per year. It allows us  to keep an eye on the functioning of your device to ensure it is working properly.

## 2024-09-15 NOTE — Progress Notes (Signed)
 HPI [name] returns today for followup of SVT. She is a pleasant 78 yo woman with a h/o SVT who underwent catheter ablation of AVNRT about 12 years ago. She was successfully ablated at site 6-8 in Koch's triangle. She has developed recurrent palpitations and documented SVT at rates of 160-180/min. These spells last minutes. She had experienced several break through episodes and started on flecainide  50 bid and cardizem 120 mg daily. In the interim she notes that her spells have largely resolved.  Allergies  Allergen Reactions   Codeine Nausea Only    REACTION: adverse reaction   Estrogens Other (See Comments)    Clotting  thrombosis, Clotting   Sulfonamide Derivatives Rash and Other (See Comments)    Fever, fluid retention      Current Outpatient Medications  Medication Sig Dispense Refill   aspirin 81 MG tablet Take 81 mg by mouth daily.     b complex vitamins tablet Take 1 tablet by mouth daily.     Calcium Carb-Cholecalciferol (CALCIUM 1000 + D PO) Take 1 tablet by mouth daily.     flecainide  (TAMBOCOR ) 50 MG tablet Take 1 tablet (50 mg total) by mouth twice daily. May also take 1 tablet (50 mg total) as needed (tachycardia, repeat if needed). Up to 3 tablets. 135 tablet 3   glucosamine-chondroitin 500-400 MG tablet Take 1 tablet by mouth daily.      Multiple Vitamin (MULTIVITAMIN) tablet Take 1 tablet by mouth daily.     rosuvastatin (CRESTOR) 5 MG tablet Take 5 mg by mouth daily. Per patient taking every other day     diltiazem (CARDIZEM CD) 120 MG 24 hr capsule Take 1 capsule (120 mg total) by mouth daily.     VITAMIN D PO Take 1,000 Units by mouth daily.     No current facility-administered medications for this visit.     Past Medical History:  Diagnosis Date   Allergy    Clotting disorder    Complication of anesthesia    once with colonoscopy   DIAPHORESIS 08/09/2010   Qualifier: Diagnosis of  By: Fernande, [name], [name]    GERD (gastroesophageal  reflux disease)    Headache(784.0)    HEMORRHOIDS, INTERNAL 06/21/2008   Qualifier: Diagnosis of  By: [name] (AAMA), [name]     Hx of blood clots    Hypercoagulable state 09/16/2012   HYPERLIPIDEMIA 05/10/2008   Qualifier: Diagnosis of  By: [name] (AAMA), [name]     IBS (irritable bowel syndrome)    remote history   Pain in joint of right hip 05/06/2018   Pain of left hip joint 05/06/2018   Palpitations 08/09/2010   Qualifier: Diagnosis of  By: Fernande, [name], [name]    PONV (postoperative nausea and vomiting)    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   Past Surgical History:  Procedure Laterality Date   BREAST EXCISIONAL BIOPSY Left 1976   benign   cyst left breast  1976   CYSTECTOMY  1977   Left Breast    SUPRAVENTRICULAR TACHYCARDIA ABLATION  09/29/2012   unsuccessful   SUPRAVENTRICULAR TACHYCARDIA ABLATION N/A 09/29/2012   Procedure: SUPRAVENTRICULAR TACHYCARDIA ABLATION;  Surgeon: Brandy LELON [place], [name];  Location: [facility] CATH LAB;  Service: Cardiovascular;  Laterality: N/A;  Family History  Problem Relation Age of Onset   Colon cancer Mother        Late 31's to early 47's   Colon polyps Mother    Diabetes Mother    Heart disease Mother    Other Mother        Irritable Bowel Syndrome   Diabetes Father    Heart disease Father    BRCA 1/2 Neg Hx    Breast cancer Neg Hx      Social History   Socioeconomic History   Marital status: Married    Spouse name: Not on file   Number of children: Not on file   Years of education: Not on file   Highest education level: Not on file  Occupational History   Not on file  Tobacco Use   Smoking status: Never   Smokeless tobacco: Never  Substance and Sexual Activity   Alcohol use: Yes    Comment: occasional 1 glass of wine 1-2 tims a month   Drug use: No    Sexual activity: Yes    Birth control/protection: None  Other Topics Concern   Not on file  Social History Narrative   Not on file   Social Drivers of Health   Financial Resource Strain: Not on file  Food Insecurity: Not on file  Transportation Needs: Not on file  Physical Activity: Not on file  Stress: Not on file  Social Connections: Not on file  Intimate Partner Violence: Not on file     BP 114/70 (BP Location: Left Arm, Patient Position: Sitting, Cuff Size: Large)   Pulse 62   Ht 5' 3.75 (1.619 m)   Wt 179 lb 14.4 oz (81.6 kg)   SpO2 92%   BMI 31.12 kg/m   Physical Exam:  Well appearing NAD HEENT: Unremarkable Neck:  No JVD, no thyromegally Lymphatics:  No adenopathy Back:  No CVA tenderness Lungs:  Clear with no wheezes HEART:  Regular rate rhythm, no murmurs, no rubs, no clicks Abd:  soft, positive bowel sounds, no organomegally, no rebound, no guarding Ext:  2 plus pulses, no edema, no cyanosis, no clubbing Skin:  No rashes no nodules Neuro:  CN II through XII intact, motor grossly intact  EKG - nsr  Assess/Plan: Recurrent SVT - I have discussed the different treatment options with the patient. As she is doing well on her current 50 mg bid of flecainide  and cardizem 120/day, no change in treatment. If she experiences break thru's of SVT, then repeat catheter ablation an option but the risk of heart block is increased.  Brandy Kayla Weekes,[name]

## 2024-10-18 ENCOUNTER — Other Ambulatory Visit: Payer: Self-pay | Admitting: Family Medicine

## 2024-10-18 DIAGNOSIS — Z1231 Encounter for screening mammogram for malignant neoplasm of breast: Secondary | ICD-10-CM

## 2024-10-19 ENCOUNTER — Inpatient Hospital Stay: Admission: RE | Admit: 2024-10-19 | Discharge: 2024-10-19 | Attending: Family Medicine | Admitting: Family Medicine

## 2024-10-19 DIAGNOSIS — Z1231 Encounter for screening mammogram for malignant neoplasm of breast: Secondary | ICD-10-CM
